# Patient Record
Sex: Female | Born: 1975 | Race: Black or African American | Hispanic: No | Marital: Single | Smoking: Former smoker
Health system: Southern US, Community
[De-identification: ages and names within clinical notes are randomized; demographics above are authoritative.]

## PROBLEM LIST (undated history)

## (undated) ENCOUNTER — Inpatient Hospital Stay (HOSPITAL_COMMUNITY): Payer: Self-pay

## (undated) DIAGNOSIS — F32A Depression, unspecified: Secondary | ICD-10-CM

## (undated) DIAGNOSIS — J45909 Unspecified asthma, uncomplicated: Secondary | ICD-10-CM

## (undated) DIAGNOSIS — F329 Major depressive disorder, single episode, unspecified: Secondary | ICD-10-CM

## (undated) DIAGNOSIS — O2230 Deep phlebothrombosis in pregnancy, unspecified trimester: Secondary | ICD-10-CM

## (undated) DIAGNOSIS — N390 Urinary tract infection, site not specified: Secondary | ICD-10-CM

## (undated) DIAGNOSIS — M1990 Unspecified osteoarthritis, unspecified site: Secondary | ICD-10-CM

## (undated) DIAGNOSIS — F4310 Post-traumatic stress disorder, unspecified: Secondary | ICD-10-CM

## (undated) DIAGNOSIS — K529 Noninfective gastroenteritis and colitis, unspecified: Secondary | ICD-10-CM

## (undated) DIAGNOSIS — K589 Irritable bowel syndrome without diarrhea: Secondary | ICD-10-CM

## (undated) DIAGNOSIS — E282 Polycystic ovarian syndrome: Secondary | ICD-10-CM

## (undated) HISTORY — DX: Post-traumatic stress disorder, unspecified: F43.10

## (undated) HISTORY — DX: Depression, unspecified: F32.A

## (undated) HISTORY — DX: Major depressive disorder, single episode, unspecified: F32.9

## (undated) HISTORY — DX: Unspecified osteoarthritis, unspecified site: M19.90

## (undated) HISTORY — DX: Polycystic ovarian syndrome: E28.2

## (undated) HISTORY — DX: Noninfective gastroenteritis and colitis, unspecified: K52.9

## (undated) HISTORY — PX: WISDOM TOOTH EXTRACTION: SHX21

---

## 1998-06-28 ENCOUNTER — Emergency Department (HOSPITAL_COMMUNITY): Admission: EM | Admit: 1998-06-28 | Discharge: 1998-06-28 | Payer: Self-pay | Admitting: Emergency Medicine

## 2000-07-20 ENCOUNTER — Emergency Department (HOSPITAL_COMMUNITY): Admission: EM | Admit: 2000-07-20 | Discharge: 2000-07-20 | Payer: Self-pay | Admitting: Emergency Medicine

## 2000-07-21 ENCOUNTER — Emergency Department (HOSPITAL_COMMUNITY): Admission: EM | Admit: 2000-07-21 | Discharge: 2000-07-21 | Payer: Self-pay

## 2003-11-06 ENCOUNTER — Emergency Department (HOSPITAL_COMMUNITY): Admission: EM | Admit: 2003-11-06 | Discharge: 2003-11-06 | Payer: Self-pay | Admitting: Emergency Medicine

## 2003-11-08 ENCOUNTER — Inpatient Hospital Stay (HOSPITAL_COMMUNITY): Admission: AD | Admit: 2003-11-08 | Discharge: 2003-11-08 | Payer: Self-pay | Admitting: *Deleted

## 2006-04-18 ENCOUNTER — Emergency Department (HOSPITAL_COMMUNITY): Admission: EM | Admit: 2006-04-18 | Discharge: 2006-04-18 | Payer: Self-pay | Admitting: Emergency Medicine

## 2006-07-29 ENCOUNTER — Ambulatory Visit (HOSPITAL_COMMUNITY): Admission: RE | Admit: 2006-07-29 | Discharge: 2006-07-29 | Payer: Self-pay | Admitting: Obstetrics and Gynecology

## 2006-12-19 ENCOUNTER — Encounter: Admission: RE | Admit: 2006-12-19 | Discharge: 2006-12-19 | Payer: Self-pay | Admitting: Family Medicine

## 2007-12-15 ENCOUNTER — Ambulatory Visit: Payer: Self-pay | Admitting: Hematology and Oncology

## 2008-04-15 ENCOUNTER — Emergency Department (HOSPITAL_COMMUNITY): Admission: EM | Admit: 2008-04-15 | Discharge: 2008-04-15 | Payer: Self-pay | Admitting: Emergency Medicine

## 2008-04-17 ENCOUNTER — Inpatient Hospital Stay (HOSPITAL_COMMUNITY): Admission: AD | Admit: 2008-04-17 | Discharge: 2008-04-17 | Payer: Self-pay | Admitting: Obstetrics and Gynecology

## 2008-04-27 ENCOUNTER — Ambulatory Visit: Payer: Self-pay | Admitting: Vascular Surgery

## 2008-04-30 ENCOUNTER — Inpatient Hospital Stay (HOSPITAL_COMMUNITY): Admission: AD | Admit: 2008-04-30 | Discharge: 2008-04-30 | Payer: Self-pay | Admitting: Obstetrics & Gynecology

## 2008-08-04 ENCOUNTER — Ambulatory Visit (HOSPITAL_COMMUNITY): Admission: RE | Admit: 2008-08-04 | Discharge: 2008-08-04 | Payer: Self-pay | Admitting: Obstetrics & Gynecology

## 2008-09-05 ENCOUNTER — Ambulatory Visit (HOSPITAL_COMMUNITY): Admission: RE | Admit: 2008-09-05 | Discharge: 2008-09-05 | Payer: Self-pay | Admitting: Obstetrics & Gynecology

## 2008-09-27 ENCOUNTER — Ambulatory Visit (HOSPITAL_COMMUNITY): Admission: RE | Admit: 2008-09-27 | Discharge: 2008-09-27 | Payer: Self-pay | Admitting: Obstetrics & Gynecology

## 2008-10-25 ENCOUNTER — Ambulatory Visit (HOSPITAL_COMMUNITY): Admission: RE | Admit: 2008-10-25 | Discharge: 2008-10-25 | Payer: Self-pay | Admitting: Obstetrics & Gynecology

## 2008-11-15 ENCOUNTER — Ambulatory Visit (HOSPITAL_COMMUNITY): Admission: RE | Admit: 2008-11-15 | Discharge: 2008-11-15 | Payer: Self-pay | Admitting: Obstetrics & Gynecology

## 2008-11-24 ENCOUNTER — Emergency Department (HOSPITAL_COMMUNITY): Admission: EM | Admit: 2008-11-24 | Discharge: 2008-11-24 | Payer: Self-pay | Admitting: Emergency Medicine

## 2008-11-28 ENCOUNTER — Inpatient Hospital Stay (HOSPITAL_COMMUNITY): Admission: RE | Admit: 2008-11-28 | Discharge: 2008-11-30 | Payer: Self-pay | Admitting: Obstetrics & Gynecology

## 2008-12-02 ENCOUNTER — Encounter: Admission: RE | Admit: 2008-12-02 | Discharge: 2008-12-31 | Payer: Self-pay | Admitting: Obstetrics & Gynecology

## 2009-02-01 ENCOUNTER — Encounter: Admission: RE | Admit: 2009-02-01 | Discharge: 2009-03-03 | Payer: Self-pay | Admitting: Obstetrics & Gynecology

## 2009-03-04 ENCOUNTER — Encounter: Admission: RE | Admit: 2009-03-04 | Discharge: 2009-04-03 | Payer: Self-pay | Admitting: Obstetrics & Gynecology

## 2009-05-02 ENCOUNTER — Encounter: Admission: RE | Admit: 2009-05-02 | Discharge: 2009-06-01 | Payer: Self-pay | Admitting: Obstetrics & Gynecology

## 2009-06-02 ENCOUNTER — Encounter: Admission: RE | Admit: 2009-06-02 | Discharge: 2009-07-02 | Payer: Self-pay | Admitting: Obstetrics & Gynecology

## 2009-07-03 ENCOUNTER — Encounter: Admission: RE | Admit: 2009-07-03 | Discharge: 2009-07-06 | Payer: Self-pay | Admitting: Obstetrics & Gynecology

## 2009-08-03 ENCOUNTER — Encounter: Admission: RE | Admit: 2009-08-03 | Discharge: 2009-09-02 | Payer: Self-pay | Admitting: Obstetrics & Gynecology

## 2009-09-03 ENCOUNTER — Encounter: Admission: RE | Admit: 2009-09-03 | Discharge: 2009-10-03 | Payer: Self-pay | Admitting: Obstetrics & Gynecology

## 2009-10-04 ENCOUNTER — Encounter: Admission: RE | Admit: 2009-10-04 | Discharge: 2009-10-04 | Payer: Self-pay | Admitting: Obstetrics & Gynecology

## 2010-01-27 ENCOUNTER — Encounter: Payer: Self-pay | Admitting: *Deleted

## 2010-01-29 ENCOUNTER — Encounter: Payer: Self-pay | Admitting: Obstetrics and Gynecology

## 2010-04-11 LAB — CBC
HCT: 34.2 % — ABNORMAL LOW (ref 36.0–46.0)
HCT: 37.2 % (ref 36.0–46.0)
Hemoglobin: 11.6 g/dL — ABNORMAL LOW (ref 12.0–15.0)
Hemoglobin: 12.4 g/dL (ref 12.0–15.0)
MCHC: 33.4 g/dL (ref 30.0–36.0)
MCHC: 33.8 g/dL (ref 30.0–36.0)
MCV: 93.1 fL (ref 78.0–100.0)
MCV: 93.1 fL (ref 78.0–100.0)
Platelets: 235 10*3/uL (ref 150–400)
Platelets: 246 10*3/uL (ref 150–400)
RBC: 3.68 MIL/uL — ABNORMAL LOW (ref 3.87–5.11)
RBC: 3.99 MIL/uL (ref 3.87–5.11)
RDW: 13.5 % (ref 11.5–15.5)
RDW: 13.6 % (ref 11.5–15.5)
WBC: 11.9 10*3/uL — ABNORMAL HIGH (ref 4.0–10.5)
WBC: 20.5 10*3/uL — ABNORMAL HIGH (ref 4.0–10.5)

## 2010-04-11 LAB — RPR: RPR Ser Ql: NONREACTIVE

## 2010-04-11 LAB — GLUCOSE, CAPILLARY: Glucose-Capillary: 108 mg/dL — ABNORMAL HIGH (ref 70–99)

## 2010-04-11 LAB — GLUCOSE, RANDOM: Glucose, Bld: 90 mg/dL (ref 70–99)

## 2010-04-11 LAB — CREATININE, SERUM
Creatinine, Ser: 0.72 mg/dL (ref 0.4–1.2)
GFR calc Af Amer: >60 mL/min (ref >60)
GFR calc non Af Amer: >60 mL/min (ref >60)

## 2010-04-11 LAB — APTT: aPTT: 26 seconds (ref 24–37)

## 2010-04-18 LAB — URINE MICROSCOPIC-ADD ON

## 2010-04-18 LAB — WET PREP, GENITAL: Trich, Wet Prep: NONE SEEN

## 2010-04-18 LAB — URINALYSIS, ROUTINE W REFLEX MICROSCOPIC
Bilirubin Urine: NEGATIVE
Glucose, UA: NEGATIVE mg/dL
Ketones, ur: NEGATIVE mg/dL
Nitrite: NEGATIVE
Protein, ur: NEGATIVE mg/dL
Specific Gravity, Urine: 1.012 (ref 1.005–1.030)
Urobilinogen, UA: 0.2 mg/dL (ref 0.0–1.0)
pH: 6.5 (ref 5.0–8.0)

## 2010-04-18 LAB — URINE CULTURE: Colony Count: NO GROWTH

## 2010-04-18 LAB — HCG, QUANTITATIVE, PREGNANCY: hCG, Beta Chain, Quant, S: 10762 m[IU]/mL — ABNORMAL HIGH (ref <5)

## 2010-04-18 LAB — PREGNANCY, URINE: Preg Test, Ur: POSITIVE

## 2012-01-08 NOTE — L&D Delivery Note (Signed)
Delivery Note At 5:05 AM a viable female was delivered via Vaginal, Spontaneous Delivery (Presentation: Left Occiput Anterior).  APGAR: 9, 9; weight .   Placenta status: Intact, Spontaneous.  Cord: 3 vessels with the following complications: None.  Cord pH: none  Anesthesia: Epidural  Episiotomy: None Lacerations: None Suture Repair: none Est. Blood Loss (mL): 350  Mom to postpartum.  Baby to Couplet care / Skin to Skin.  Kaden Daughdrill A 12/30/2012, 6:07 AM

## 2012-03-22 ENCOUNTER — Ambulatory Visit: Payer: Self-pay | Admitting: Family Medicine

## 2012-03-22 VITALS — BP 120/84 | HR 94 | Temp 98.8°F | Resp 16 | Ht 65.0 in | Wt 255.0 lb

## 2012-03-22 DIAGNOSIS — R300 Dysuria: Secondary | ICD-10-CM

## 2012-03-22 DIAGNOSIS — N390 Urinary tract infection, site not specified: Secondary | ICD-10-CM

## 2012-03-22 LAB — POCT URINALYSIS DIPSTICK
Bilirubin, UA: NEGATIVE
Glucose, UA: NEGATIVE
Ketones, UA: NEGATIVE
Nitrite, UA: NEGATIVE
Protein, UA: 100
Spec Grav, UA: 1.015
Urobilinogen, UA: 0.2
pH, UA: 7

## 2012-03-22 LAB — POCT UA - MICROSCOPIC ONLY
Casts, Ur, LPF, POC: NEGATIVE
Mucus, UA: NEGATIVE
Renal tubular cells: POSITIVE
Yeast, UA: NEGATIVE

## 2012-03-22 MED ORDER — CIPROFLOXACIN HCL 500 MG PO TABS: 500.0000 mg | ORAL_TABLET | Freq: Two times a day (BID) | ORAL | Status: DC

## 2012-03-22 NOTE — Progress Notes (Signed)
Subjective:    Patient ID: Patricia Fitzgerald, female    DOB: 1975/10/31, 37 y.o.   MRN: [id number] Chief Complaint  Patient presents with  . Dysuria  . Pelvic Pain   HPI Has had some burning with urination and lower abdominal pain.  She had some light burning with urination several days ago so thought she wasn't drinking enough water - so started pushing fluids earlier this wk. Today her sxs became really severe.  The abd pain was there this morning - she awoke with it - she took a norco 10/325 that she has for her chronic arthritis - and then had urethral burning after urination.  This morning has had increased frequency, hesitency, urine decreased, no f/c.  Looked a little cloudy. No n/v. Unsure about back pains as has bilateral labral tears and severe arthritis.  No recent abx use, no vag discharge.  Was having some light vaginal bleeding this morning - has PCOS so irreg periods and with intercourse last night.  Has never had a UTI or kidney stone prev.  Was taking sulfasalazine for her ulcerative colitis but stopped taking it 2 wks ago as it was disturbing her monthly menstrual cycle and she read that this med could contribute to UTIs.  Past Medical History  Diagnosis Date  . Depression   . Arthritis   . PCOS (polycystic ovarian syndrome)   . Post traumatic stress disorder (PTSD)   . Inflammatory bowel diseases (IBD)    No current outpatient prescriptions on file prior to visit.   No current facility-administered medications on file prior to visit.   Not on File   Review of Systems  Constitutional: Negative for fever, chills, diaphoresis, activity change, appetite change, fatigue and unexpected weight change.  Gastrointestinal: Positive for abdominal pain. Negative for nausea, vomiting, blood in stool and anal bleeding.  Genitourinary: Positive for dysuria, urgency, frequency, decreased urine volume, vaginal bleeding, difficulty urinating, menstrual problem and pelvic pain. Negative  for hematuria, flank pain, vaginal discharge, enuresis, genital sores, vaginal pain and dyspareunia.  Musculoskeletal: Positive for myalgias, arthralgias and gait problem.  Skin: Negative for rash.  Hematological: Negative for adenopathy.      BP 120/84  Pulse 94  Temp(Src) 98.8 F (37.1 C) (Oral)  Resp 16  Ht 5\' 5"  (1.651 m)  Wt 255 lb (115.667 kg)  BMI 42.43 kg/m2  SpO2 100% Objective:   Physical Exam  Constitutional: She is oriented to person, place, and time. She appears well-developed and well-nourished. No distress.  HENT:  Head: Normocephalic and atraumatic.  Neck: Normal range of motion. Neck supple. No thyromegaly present.  Cardiovascular: Normal rate, regular rhythm, normal heart sounds and intact distal pulses.   Pulmonary/Chest: Effort normal and breath sounds normal. No respiratory distress.  Abdominal: Soft. Bowel sounds are normal. There is no hepatosplenomegaly. There is generalized tenderness. There is no rebound, no guarding and no CVA tenderness. No hernia.  Genitourinary: No labial fusion. There is no rash, tenderness, lesion or injury on the right labia. There is no rash, tenderness, lesion or injury on the left labia. Uterus is not deviated, not enlarged, not fixed and not tender. Cervix exhibits no motion tenderness, no discharge and no friability. Right adnexum displays no mass, no tenderness and no fullness. Left adnexum displays no mass, no tenderness and no fullness. No erythema, tenderness or bleeding around the vagina. No foreign body around the vagina. No signs of injury around the vagina. No vaginal discharge found.  Musculoskeletal: She exhibits no edema.  Lymphadenopathy:    She has no cervical adenopathy.  Neurological: She is alert and oriented to person, place, and time.  Skin: Skin is warm and dry. She is not diaphoretic. No erythema.  Psychiatric: She has a normal mood and affect. Her behavior is normal.      Results for orders placed in visit on  03/22/12  POCT URINALYSIS DIPSTICK      Result Value Range   Color, UA yellow     Clarity, UA cloudy     Glucose, UA neg     Bilirubin, UA neg     Ketones, UA neg     Spec Grav, UA 1.015     Blood, UA large     pH, UA 7.0     Protein, UA 100     Urobilinogen, UA 0.2     Nitrite, UA neg     Leukocytes, UA small (1+)    POCT UA - MICROSCOPIC ONLY      Result Value Range   WBC, Ur, HPF, POC TNTC     RBC, urine, microscopic TNTC     Bacteria, U Microscopic neg     Mucus, UA neg     Epithelial cells, urine per micros 1-4     Crystals, Ur, HPF, POC neg     Casts, Ur, LPF, POC neg     Yeast, UA neg     Renal tubular cells pos      Assessment & Plan:  Dysuria - Plan: POCT urinalysis dipstick, POCT UA - Microscopic Only, Urine culture  UTI (urinary tract infection) - lots of blood but no bacteria. Will cover with cipro while culture is pending. If clx neg, rec return to clinic for further eval - PCOS, IBS could be complicating or may even need to look for kidney stones.  Meds ordered this encounter  Medications  . ALPRAZolam (XANAX) 1 MG tablet    Sig: Take 1 mg by mouth at bedtime as needed for sleep.  Marland Kitchen venlafaxine (EFFEXOR) 75 MG tablet    Sig: Take 75 mg by mouth 3 (three) times daily.  Marland Kitchen zolpidem (AMBIEN) 10 MG tablet    Sig: Take 10 mg by mouth at bedtime as needed for sleep. Pt not sure of dose she takes.  Marland Kitchen gabapentin (NEURONTIN) 100 MG capsule    Sig: Take 100 mg by mouth. Pt not sure of what dose she takes.  Marland Kitchen HYDROcodone-acetaminophen (NORCO) 10-325 MG per tablet    Sig: Take 1 tablet by mouth. Pt not sure of what dose she takes.  . ciprofloxacin (CIPRO) 500 MG tablet    Sig: Take 1 tablet (500 mg total) by mouth 2 (two) times daily.    Dispense:  6 tablet    Refill:  0

## 2012-03-22 NOTE — Patient Instructions (Addendum)
Urinary Tract Infection Urinary tract infections (UTIs) can develop anywhere along your urinary tract. Your urinary tract is your body's drainage system for removing wastes and extra water. Your urinary tract includes two kidneys, two ureters, a bladder, and a urethra. Your kidneys are a pair of bean-shaped organs. Each kidney is about the size of your fist. They are located below your ribs, one on each side of your spine. CAUSES Infections are caused by microbes, which are microscopic organisms, including fungi, viruses, and bacteria. These organisms are so small that they can only be seen through a microscope. Bacteria are the microbes that most commonly cause UTIs. SYMPTOMS  Symptoms of UTIs may vary by age and gender of the patient and by the location of the infection. Symptoms in young women typically include a frequent and intense urge to urinate and a painful, burning feeling in the bladder or urethra during urination. Older women and men are more likely to be tired, shaky, and weak and have muscle aches and abdominal pain. A fever may mean the infection is in your kidneys. Other symptoms of a kidney infection include pain in your back or sides below the ribs, nausea, and vomiting. DIAGNOSIS To diagnose a UTI, your caregiver will ask you about your symptoms. Your caregiver also will ask to provide a urine sample. The urine sample will be tested for bacteria and white blood cells. White blood cells are made by your body to help fight infection. TREATMENT  Typically, UTIs can be treated with medication. Because most UTIs are caused by a bacterial infection, they usually can be treated with the use of antibiotics. The choice of antibiotic and length of treatment depend on your symptoms and the type of bacteria causing your infection. HOME CARE INSTRUCTIONS  If you were prescribed antibiotics, take them exactly as your caregiver instructs you. Finish the medication even if you feel better after you  have only taken some of the medication.  Drink enough water and fluids to keep your urine clear or pale yellow.  Avoid caffeine, tea, and carbonated beverages. They tend to irritate your bladder.  Empty your bladder often. Avoid holding urine for long periods of time.  Empty your bladder before and after sexual intercourse.  After a bowel movement, women should cleanse from front to back. Use each tissue only once. SEEK MEDICAL CARE IF:   You have back pain.  You develop a fever.  Your symptoms do not begin to resolve within 3 days. SEEK IMMEDIATE MEDICAL CARE IF:   You have severe back pain or lower abdominal pain.  You develop chills.  You have nausea or vomiting.  You have continued burning or discomfort with urination. MAKE SURE YOU:   Understand these instructions.  Will watch your condition.  Will get help right away if you are not doing well or get worse. Document Released: 10/03/2004 Document Revised: 06/25/2011 Document Reviewed: 02/01/2011 [employer] Patient Information 2013 [employer].  

## 2012-03-25 LAB — URINE CULTURE: Colony Count: 65000

## 2012-05-13 ENCOUNTER — Inpatient Hospital Stay (HOSPITAL_COMMUNITY)
Admission: AD | Admit: 2012-05-13 | Discharge: 2012-05-13 | Disposition: A | Discharge status: Home / Self Care | Payer: Medicaid Other | Source: Ambulatory Visit | Attending: Obstetrics & Gynecology | Admitting: Obstetrics & Gynecology

## 2012-05-13 ENCOUNTER — Inpatient Hospital Stay (HOSPITAL_COMMUNITY): Payer: Medicaid Other

## 2012-05-13 ENCOUNTER — Encounter (HOSPITAL_COMMUNITY): Payer: Self-pay

## 2012-05-13 DIAGNOSIS — B9689 Other specified bacterial agents as the cause of diseases classified elsewhere: Secondary | ICD-10-CM | POA: Insufficient documentation

## 2012-05-13 DIAGNOSIS — A499 Bacterial infection, unspecified: Secondary | ICD-10-CM

## 2012-05-13 DIAGNOSIS — O209 Hemorrhage in early pregnancy, unspecified: Secondary | ICD-10-CM | POA: Insufficient documentation

## 2012-05-13 DIAGNOSIS — O2390 Unspecified genitourinary tract infection in pregnancy, unspecified trimester: Secondary | ICD-10-CM | POA: Insufficient documentation

## 2012-05-13 DIAGNOSIS — N760 Acute vaginitis: Secondary | ICD-10-CM | POA: Insufficient documentation

## 2012-05-13 HISTORY — DX: Deep phlebothrombosis in pregnancy, unspecified trimester: O22.30

## 2012-05-13 HISTORY — DX: Urinary tract infection, site not specified: N39.0

## 2012-05-13 HISTORY — DX: Irritable bowel syndrome, unspecified: K58.9

## 2012-05-13 HISTORY — DX: Post-traumatic stress disorder, unspecified: F43.10

## 2012-05-13 LAB — CBC
HCT: 34 % — ABNORMAL LOW (ref 36.0–46.0)
Hemoglobin: 10.9 g/dL — ABNORMAL LOW (ref 12.0–15.0)
MCHC: 32.1 g/dL (ref 30.0–36.0)
RBC: 4.01 MIL/uL (ref 3.87–5.11)
WBC: 9.9 10*3/uL (ref 4.0–10.5)

## 2012-05-13 LAB — ABO/RH: ABO/RH(D): A POS

## 2012-05-13 LAB — OB RESULTS CONSOLE GC/CHLAMYDIA: Chlamydia: NEGATIVE

## 2012-05-13 LAB — HCG, QUANTITATIVE, PREGNANCY: hCG, Beta Chain, Quant, S: 3317 m[IU]/mL — ABNORMAL HIGH (ref <5)

## 2012-05-13 MED ORDER — METRONIDAZOLE 500 MG PO TABS: 500.0000 mg | ORAL_TABLET | Freq: Two times a day (BID) | ORAL | Status: DC

## 2012-05-13 NOTE — MAU Note (Signed)
Pt states LMP-03/31/2012, had pap smear done last Wednesday. Had spotting since then, however last pm went from spotting to bleeding small amt. Denies blood clots. Hx of blood clots and miscarriages.

## 2012-05-13 NOTE — MAU Provider Note (Signed)
History     CSN: [id number]  Arrival date and time: 05/13/12 4098   First Provider Initiated Contact with Patient 05/13/12 1033      Chief Complaint  Patient presents with  . Vaginal Bleeding   HPI Ms. Makari Sanko is a 37 y.o. [id number] at [redacted]w[redacted]d who present to MAU today with complaint of vaginal bleeding. The patient states that she had a pap smear last Wednesday and had some spotting afterward. This changed to heavier bleeding yesterday. She did not bleed enough to soak a pad. She has not yet had care or [place] for this pregnancy. She plans to get care at the [facility] hospital in [place]. She denies other vaginal discharge, abdominal pain, N/V, fever, UTI symptoms or dizziness. She had some off and on cramping yesterday, but none today.   OB History   Grav Para Term Preterm Abortions TAB SAB Ect Mult Living   5    3 1 2          Past Medical History  Diagnosis Date  . Depression   . Arthritis   . PCOS (polycystic ovarian syndrome)   . Post traumatic stress disorder (PTSD)   . Inflammatory bowel diseases (IBD)   . PTSD (post-traumatic stress disorder)   . UTI (lower urinary tract infection)   . IBS (irritable bowel syndrome)   . DVT (deep vein thrombosis) in pregnancy     left leg.    History reviewed. No pertinent past surgical history.  Family History  Problem Relation Age of Onset  . Diabetes Mother   . Congestive Heart Failure Mother   . Diabetes Father   . Diabetes Brother   . Cancer Maternal Grandmother   . Heart disease Paternal Grandmother     History  Substance Use Topics  . Smoking status: Former Games developer  . Smokeless tobacco: Not on file  . Alcohol Use: No    Allergies: No Known Allergies  Prescriptions prior to admission  Medication Sig Dispense Refill  . ferrous sulfate 325 (65 FE) MG tablet Take 325 mg by mouth daily with breakfast.      . FOLIC ACID PO Take 1 tablet by mouth daily.      Marland Kitchen venlafaxine (EFFEXOR) 75 MG tablet Take 75 mg by mouth 3  (three) times daily.        Review of Systems  Constitutional: Negative for fever and malaise/fatigue.  Gastrointestinal: Negative for nausea, vomiting, abdominal pain, diarrhea and constipation.  Genitourinary: Negative for dysuria, urgency and frequency.       + vaginal bleeding Neg - vaginal discharge  Neurological: Negative for dizziness.   Physical Exam   Blood pressure 128/79, pulse 77, temperature 98.1 F (36.7 C), temperature source Oral, resp. rate 16, height 5\' 4"  (1.626 m), last menstrual period 03/31/2012.  Physical Exam  Constitutional: She is oriented to person, place, and time. She appears well-developed and well-nourished. No distress.  HENT:  Head: Normocephalic and atraumatic.  Cardiovascular: Normal rate, regular rhythm and normal heart sounds.   Respiratory: Effort normal and breath sounds normal. No respiratory distress.  GI: Soft. Bowel sounds are normal. She exhibits no distension and no mass. There is no tenderness. There is no rebound and no guarding.  Genitourinary: Uterus is not enlarged (exam difficult due to maternal body habitus) and not tender. Cervix exhibits discharge (small amount of brown blood noted at the cervical os). Cervix exhibits no motion tenderness and no friability. Right adnexum displays no mass and no tenderness. Left adnexum  displays no mass and no tenderness. There is bleeding (small amount of dark blood noted in the vagina) around the vagina. No vaginal discharge found.  Neurological: She is alert and oriented to person, place, and time.  Skin: Skin is warm and dry. No erythema.  Psychiatric: She has a normal mood and affect.   Results for orders placed during the hospital encounter of 05/13/12 (from the past 24 hour(s))  POCT PREGNANCY, URINE     Status: Abnormal   Collection Time    05/13/12 10:09 AM      Result Value Range   Preg Test, Ur POSITIVE (*) NEGATIVE  WET PREP, GENITAL     Status: Abnormal   Collection Time    05/13/12  10:36 AM      Result Value Range   Yeast Wet Prep HPF POC NONE SEEN  NONE SEEN   Trich, Wet Prep NONE SEEN  NONE SEEN   Clue Cells Wet Prep HPF POC FEW (*) NONE SEEN   WBC, Wet Prep HPF POC MODERATE (*) NONE SEEN  CBC     Status: Abnormal   Collection Time    05/13/12 10:50 AM      Result Value Range   WBC 9.9  4.0 - 10.5 K/uL   RBC 4.01  3.87 - 5.11 MIL/uL   Hemoglobin 10.9 (*) 12.0 - 15.0 g/dL   HCT 16.1 (*) 09.6 - 04.5 %   MCV 84.8  78.0 - 100.0 fL   MCH 27.2  26.0 - 34.0 pg   MCHC 32.1  30.0 - 36.0 g/dL   RDW 40.9  81.1 - 91.4 %   Platelets 271  150 - 400 K/uL  ABO/RH     Status: None   Collection Time    05/13/12 10:50 AM      Result Value Range   ABO/RH(D) A POS      MAU Course  Procedures None  MDM Wet prep, GC/Chlamydia, CBC, ABO/Rh, quant hCG and [place] today  Assessment and Plan  A: IUGS and YS at 5w 1 d Bleeding in early pregnancy Bacterial vaginosis  P: Discharge home Rx for Flagyl sent to patient's pharmacy Bleeding precautions discussed Pregnancy confirmation letter given Patient encouraged to start care with provider of her choice Patient may return to MAU as needed or if her condition were to change or worsen  Freddi Starr, PA-C  05/13/2012, 12:06 PM

## 2012-05-14 LAB — GC/CHLAMYDIA PROBE AMP
CT Probe RNA: NEGATIVE
GC Probe RNA: NEGATIVE

## 2012-05-17 ENCOUNTER — Encounter (HOSPITAL_COMMUNITY): Payer: Self-pay | Admitting: Family Medicine

## 2012-05-17 ENCOUNTER — Emergency Department (HOSPITAL_COMMUNITY)
Admission: EM | Admit: 2012-05-17 | Discharge: 2012-05-17 | Disposition: A | Discharge status: Home / Self Care | Payer: No Typology Code available for payment source | Attending: Emergency Medicine | Admitting: Emergency Medicine

## 2012-05-17 DIAGNOSIS — Z862 Personal history of diseases of the blood and blood-forming organs and certain disorders involving the immune mechanism: Secondary | ICD-10-CM | POA: Insufficient documentation

## 2012-05-17 DIAGNOSIS — Z86718 Personal history of other venous thrombosis and embolism: Secondary | ICD-10-CM | POA: Insufficient documentation

## 2012-05-17 DIAGNOSIS — Z8639 Personal history of other endocrine, nutritional and metabolic disease: Secondary | ICD-10-CM | POA: Insufficient documentation

## 2012-05-17 DIAGNOSIS — F329 Major depressive disorder, single episode, unspecified: Secondary | ICD-10-CM | POA: Insufficient documentation

## 2012-05-17 DIAGNOSIS — T07XXXA Unspecified multiple injuries, initial encounter: Secondary | ICD-10-CM

## 2012-05-17 DIAGNOSIS — Y9389 Activity, other specified: Secondary | ICD-10-CM | POA: Insufficient documentation

## 2012-05-17 DIAGNOSIS — Z87891 Personal history of nicotine dependence: Secondary | ICD-10-CM | POA: Insufficient documentation

## 2012-05-17 DIAGNOSIS — Z79899 Other long term (current) drug therapy: Secondary | ICD-10-CM | POA: Insufficient documentation

## 2012-05-17 DIAGNOSIS — Z8739 Personal history of other diseases of the musculoskeletal system and connective tissue: Secondary | ICD-10-CM | POA: Insufficient documentation

## 2012-05-17 DIAGNOSIS — F3289 Other specified depressive episodes: Secondary | ICD-10-CM | POA: Insufficient documentation

## 2012-05-17 DIAGNOSIS — Z8719 Personal history of other diseases of the digestive system: Secondary | ICD-10-CM | POA: Insufficient documentation

## 2012-05-17 DIAGNOSIS — IMO0002 Reserved for concepts with insufficient information to code with codable children: Secondary | ICD-10-CM | POA: Insufficient documentation

## 2012-05-17 DIAGNOSIS — Z87440 Personal history of urinary (tract) infections: Secondary | ICD-10-CM | POA: Insufficient documentation

## 2012-05-17 DIAGNOSIS — Y92410 Unspecified street and highway as the place of occurrence of the external cause: Secondary | ICD-10-CM | POA: Insufficient documentation

## 2012-05-17 DIAGNOSIS — Z8659 Personal history of other mental and behavioral disorders: Secondary | ICD-10-CM | POA: Insufficient documentation

## 2012-05-17 MED ORDER — ACETAMINOPHEN 500 MG PO TABS: 500.0000 mg | ORAL_TABLET | Freq: Four times a day (QID) | ORAL | Status: DC | PRN
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
## 2012-05-17 NOTE — ED Notes (Signed)
PT ambulated with baseline gait; VSS; A&Ox3; no signs of distress; respirations even and unlabored; skin warm and dry; no questions upon discharge.  
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
## 2012-05-17 NOTE — ED Provider Notes (Signed)
Medical screening examination/treatment/procedure(s) were performed by non-physician practitioner and as supervising physician I was immediately available for consultation/collaboration. Sencere Symonette, MD, FACEP   Veryl Winemiller L Andry Bogden, MD 05/17/12 1636 

## 2012-06-28 ENCOUNTER — Inpatient Hospital Stay (HOSPITAL_COMMUNITY)
Admission: AD | Admit: 2012-06-28 | Discharge: 2012-06-29 | Disposition: A | Discharge status: Home / Self Care | Payer: Medicaid Other | Source: Ambulatory Visit | Attending: Obstetrics & Gynecology | Admitting: Obstetrics & Gynecology

## 2012-06-28 DIAGNOSIS — O4692 Antepartum hemorrhage, unspecified, second trimester: Secondary | ICD-10-CM

## 2012-06-28 DIAGNOSIS — O209 Hemorrhage in early pregnancy, unspecified: Secondary | ICD-10-CM | POA: Insufficient documentation

## 2012-06-28 HISTORY — DX: Unspecified asthma, uncomplicated: J45.909
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
## 2012-06-29 ENCOUNTER — Encounter (HOSPITAL_COMMUNITY): Payer: Self-pay | Admitting: *Deleted

## 2012-06-29 DIAGNOSIS — O4690 Antepartum hemorrhage, unspecified, unspecified trimester: Secondary | ICD-10-CM

## 2012-06-29 LAB — CBC
HCT: 35 % — ABNORMAL LOW (ref 36.0–46.0)
Hemoglobin: 11.7 g/dL — ABNORMAL LOW (ref 12.0–15.0)
MCHC: 33.4 g/dL (ref 30.0–36.0)
WBC: 10.8 10*3/uL — ABNORMAL HIGH (ref 4.0–10.5)

## 2012-06-29 LAB — WET PREP, GENITAL
Clue Cells Wet Prep HPF POC: NONE SEEN
Trich, Wet Prep: NONE SEEN

## 2012-06-29 LAB — HCG, QUANTITATIVE, PREGNANCY: hCG, Beta Chain, Quant, S: 48907 m[IU]/mL — ABNORMAL HIGH (ref <5)

## 2012-06-29 MED ORDER — ONDANSETRON 4 MG PO TBDP: 4.0000 mg | ORAL_TABLET | Freq: Three times a day (TID) | ORAL | Status: DC | PRN

## 2012-06-29 NOTE — MAU Provider Note (Signed)
History     CSN: [id number]  Arrival date and time: 06/28/12 2341   First Provider Initiated Contact with Patient 06/29/12 0033      Chief Complaint  Patient presents with  . Vaginal Bleeding   Vaginal Bleeding    Patricia Fitzgerald is a 37 y.o. [id number] at [redacted]w[redacted]d who presents today with vaginal bleeding. She states she last had intercourse on Wednesday. She had some spotting after that, and then today she had a "gush" that happened once. She has not had to wear a pad. She denies any cramping or pain. She has an appointment on July 7th with Dr. Tamela Oddi.   Past Medical History  Diagnosis Date  . Depression   . Arthritis   . PCOS (polycystic ovarian syndrome)   . Post traumatic stress disorder (PTSD)   . Inflammatory bowel diseases (IBD)   . PTSD (post-traumatic stress disorder)   . UTI (lower urinary tract infection)   . IBS (irritable bowel syndrome)   . DVT (deep vein thrombosis) in pregnancy     left leg.  . Asthma     Past Surgical History  Procedure Laterality Date  . Wisdom tooth extraction      Family History  Problem Relation Age of Onset  . Diabetes Mother   . Congestive Heart Failure Mother   . Diabetes Father   . Diabetes Brother   . Cancer Maternal Grandmother   . Heart disease Paternal Grandmother     History  Substance Use Topics  . Smoking status: Former Smoker -- 0.25 packs/day for .5 years    Types: Cigarettes    Quit date: 06/30/2002  . Smokeless tobacco: Not on file  . Alcohol Use: No    Allergies: No Known Allergies  Prescriptions prior to admission  Medication Sig Dispense Refill  . acetaminophen (TYLENOL) 500 MG tablet Take 1 tablet (500 mg total) by mouth every 6 (six) hours as needed for pain.  30 tablet  0  . ferrous sulfate 325 (65 FE) MG tablet Take 325 mg by mouth daily with breakfast.      . FOLIC ACID PO Take 1 tablet by mouth daily.      . metroNIDAZOLE (FLAGYL) 500 MG tablet Take 1 tablet (500 mg total) by mouth 2 (two)  times daily.  14 tablet  0  . venlafaxine (EFFEXOR) 75 MG tablet Take 75 mg by mouth 3 (three) times daily.        Review of Systems  Genitourinary: Positive for vaginal bleeding.   Physical Exam   Blood pressure 126/71, pulse 98, temperature 99.2 F (37.3 C), temperature source Oral, resp. rate 20, height 5\' 4"  (1.626 m), weight 119.296 kg (263 lb), last menstrual period 03/31/2012, SpO2 100.00%.  Physical Exam  Nursing note and vitals reviewed. Constitutional: She is oriented to person, place, and time. She appears well-developed and well-nourished. No distress.  Cardiovascular: Normal rate.   Respiratory: Effort normal.  GI: Soft. There is no tenderness.  Genitourinary:   .External: no lesion Vagina: small amount of brown blood seen Cervix: pink, smooth, no CMT, closed Uterus: AGA, fht 150's   Neurological: She is alert and oriented to person, place, and time.  Skin: Skin is warm and dry.  Psychiatric: She has a normal mood and affect.    MAU Course  Procedures  Results for orders placed during the hospital encounter of 06/28/12 (from the past 24 hour(s))  CBC     Status: Abnormal   Collection Time  06/28/12 11:55 PM      Result Value Range   WBC 10.8 (*) 4.0 - 10.5 K/uL   RBC 4.18  3.87 - 5.11 MIL/uL   Hemoglobin 11.7 (*) 12.0 - 15.0 g/dL   HCT 16.1 (*) 09.6 - 04.5 %   MCV 83.7  78.0 - 100.0 fL   MCH 28.0  26.0 - 34.0 pg   MCHC 33.4  30.0 - 36.0 g/dL   RDW 40.9  81.1 - 91.4 %   Platelets 257  150 - 400 K/uL     Assessment and Plan   1. Vaginal bleeding in pregnancy, second trimester    Bleeding precautions reviewed Start pnc as soon as possible Return to MAU as needed   Tawnya Crook 06/29/2012, 12:37 AM

## 2012-06-30 LAB — GC/CHLAMYDIA PROBE AMP: CT Probe RNA: NEGATIVE

## 2012-07-06 NOTE — MAU Provider Note (Signed)
Pt had IUGS at 5 weeks.  FHT heard today.  Attestation of Attending Supervision of Advanced Practitioner (CNM/NP): Evaluation and management procedures were performed by the Advanced Practitioner under my supervision and collaboration. I have reviewed the Advanced Practitioner's note and chart, and I agree with the management and plan.  Evora Schechter H. 9:30 AM

## 2012-07-13 ENCOUNTER — Ambulatory Visit (INDEPENDENT_AMBULATORY_CARE_PROVIDER_SITE_OTHER): Payer: Medicaid Other | Admitting: Obstetrics & Gynecology

## 2012-07-13 ENCOUNTER — Encounter: Payer: Self-pay | Admitting: Obstetrics & Gynecology

## 2012-07-13 VITALS — BP 119/82 | Temp 98.7°F | Wt 260.6 lb

## 2012-07-13 DIAGNOSIS — O09529 Supervision of elderly multigravida, unspecified trimester: Secondary | ICD-10-CM

## 2012-07-13 DIAGNOSIS — O09899 Supervision of other high risk pregnancies, unspecified trimester: Secondary | ICD-10-CM | POA: Insufficient documentation

## 2012-07-13 DIAGNOSIS — Z3480 Encounter for supervision of other normal pregnancy, unspecified trimester: Secondary | ICD-10-CM

## 2012-07-13 DIAGNOSIS — O99212 Obesity complicating pregnancy, second trimester: Secondary | ICD-10-CM | POA: Insufficient documentation

## 2012-07-13 DIAGNOSIS — Z3482 Encounter for supervision of other normal pregnancy, second trimester: Secondary | ICD-10-CM

## 2012-07-13 DIAGNOSIS — E669 Obesity, unspecified: Secondary | ICD-10-CM

## 2012-07-13 DIAGNOSIS — O0990 Supervision of high risk pregnancy, unspecified, unspecified trimester: Secondary | ICD-10-CM | POA: Insufficient documentation

## 2012-07-13 DIAGNOSIS — O09892 Supervision of other high risk pregnancies, second trimester: Secondary | ICD-10-CM

## 2012-07-13 DIAGNOSIS — O2230 Deep phlebothrombosis in pregnancy, unspecified trimester: Secondary | ICD-10-CM

## 2012-07-13 DIAGNOSIS — I82409 Acute embolism and thrombosis of unspecified deep veins of unspecified lower extremity: Secondary | ICD-10-CM

## 2012-07-13 DIAGNOSIS — IMO0002 Reserved for concepts with insufficient information to code with codable children: Secondary | ICD-10-CM | POA: Insufficient documentation

## 2012-07-13 LAB — POCT URINALYSIS DIPSTICK
Blood, UA: NEGATIVE
Glucose, UA: NEGATIVE
Ketones, UA: NEGATIVE
Spec Grav, UA: 1.01

## 2012-07-13 NOTE — Progress Notes (Signed)
Pulse- 85 . Subjective:    Patricia Fitzgerald is being seen today for her first obstetrical visit.  This is not a planned pregnancy. She is at [redacted]w[redacted]d gestation. Her obstetrical history is significant for DVT. Relationship with FOB: ex-spouse. Patient does intend to breast feed. Pregnancy history fully reviewed.  Menstrual History: OB History   Grav Para Term Preterm Abortions TAB SAB Ect Mult Living   5 1 1  3  0 2   1      Menarche age: 37    Patient's last menstrual period was 03/31/2012.    The following portions of the patient's history were reviewed and updated as appropriate: allergies, current medications, past family history, past medical history, past social history, past surgical history and problem list.  Review of Systems Pertinent items are noted in HPI.    Objective:   No exam today  Assessment:    Pregnancy at [redacted]w[redacted]d weeks    Plan:   Review previous records Problem list reviewed and updated. Referral -->MFM Routine Dental care/cleanings recommended Recommend 10 - 20 lb weight gain Role of ultrasound in pregnancy discussed; fetal survey: requested.  Follow up in 4 weeks. 50% of 15 min visit spent on counseling and coordination of care.

## 2012-07-13 NOTE — Progress Notes (Signed)
Cardiac activity on U/S. 

## 2012-07-13 NOTE — Patient Instructions (Addendum)
Pregnancy - Second Trimester The second trimester is the period between 13 to 27 weeks of your pregnancy. It is important to follow your doctor's instructions. HOME CARE   Do not smoke.  Do not drink alcohol or use drugs.  Only take medicine as told by your doctor.  Take prenatal vitamins as told. The vitamin should contain 1 milligram of folic acid.  Exercise.  Eat healthy foods. Eat regular, well-balanced meals.  You can have sex (intercourse) if there are no other problems with the pregnancy.  Do not use hot tubs, steam rooms, or saunas.  Wear a seat belt while driving.  Avoid raw meat, uncooked cheese, and litter boxes and soil used by cats.  Visit your dentist. Cleanings are okay. GET HELP RIGHT AWAY IF:   You have a temperature by mouth above 102 F (38.9 C), not controlled by medicine.  Fluid is coming from your vagina.  Blood is coming from your vagina. Light spotting is common, especially after sex (intercourse).  You have a bad smelling fluid (discharge) coming from the vagina. The fluid changes from clear to white.  You still feel sick to your stomach (nauseous).  You throw up (vomit) blood.  You lose or gain more than 2 pounds (0.9 kilograms) of weight in a week, or as suggested by your doctor.  Your face, hands, feet, or legs get puffy (swell).  You get exposed to German measles and have never had them.  You get exposed to fifth disease or chickenpox.  You have belly (abdominal) pain.  You have a bad headache that will not go away.  You have watery poop (diarrhea), pain when you pee (urinate), or have shortness of breath.  You start to have problems seeing (blurry or double vision).  You fall, are in a car accident, or have any kind of trauma.  There is mental or physical violence at home.  You have any concerns or worries during your pregnancy. MAKE SURE YOU:   Understand these instructions.  Will watch your condition.  Will get help  right away if you are not doing well or get worse. Document Released: 03/20/2009 Document Revised: 03/18/2011 Document Reviewed: 03/20/2009 Exit[employer] Patient Information 2014 [employer].  

## 2012-07-14 LAB — OBSTETRIC PANEL
Basophils Absolute: 0 10*3/uL (ref 0.0–0.1)
Eosinophils Absolute: 0.1 10*3/uL (ref 0.0–0.7)
Hepatitis B Surface Ag: NEGATIVE
Lymphs Abs: 3.1 10*3/uL (ref 0.7–4.0)
MCH: 27.8 pg (ref 26.0–34.0)
Neutrophils Relative %: 55 % (ref 43–77)
Platelets: 298 10*3/uL (ref 150–400)
RBC: 4.42 MIL/uL (ref 3.87–5.11)
WBC: 8.7 10*3/uL (ref 4.0–10.5)

## 2012-07-14 LAB — T4, FREE: Free T4: 1.22 ng/dL (ref 0.80–1.80)

## 2012-07-14 LAB — CULTURE, OB URINE: Colony Count: NO GROWTH

## 2012-07-15 LAB — HEMOGLOBINOPATHY EVALUATION
Hemoglobin Other: 0 %
Hgb A2 Quant: 2.7 % (ref 2.2–3.2)
Hgb A: 97.3 % (ref 96.8–97.8)
Hgb F Quant: 0 % (ref 0.0–2.0)
Hgb S Quant: 0 %

## 2012-07-27 DIAGNOSIS — Z8742 Personal history of other diseases of the female genital tract: Secondary | ICD-10-CM | POA: Insufficient documentation

## 2012-07-27 DIAGNOSIS — O99340 Other mental disorders complicating pregnancy, unspecified trimester: Secondary | ICD-10-CM | POA: Insufficient documentation

## 2012-07-27 DIAGNOSIS — O09529 Supervision of elderly multigravida, unspecified trimester: Secondary | ICD-10-CM | POA: Insufficient documentation

## 2012-07-28 ENCOUNTER — Other Ambulatory Visit: Payer: Self-pay | Admitting: Obstetrics & Gynecology

## 2012-07-28 ENCOUNTER — Institutional Professional Consult (permissible substitution): Payer: Medicaid Other

## 2012-07-28 DIAGNOSIS — Z0489 Encounter for examination and observation for other specified reasons: Secondary | ICD-10-CM

## 2012-07-29 ENCOUNTER — Encounter: Payer: Self-pay | Admitting: Obstetrics & Gynecology

## 2012-07-29 DIAGNOSIS — G4730 Sleep apnea, unspecified: Secondary | ICD-10-CM | POA: Insufficient documentation

## 2012-08-07 ENCOUNTER — Encounter: Payer: Self-pay | Admitting: Obstetrics & Gynecology

## 2012-08-07 DIAGNOSIS — G4733 Obstructive sleep apnea (adult) (pediatric): Secondary | ICD-10-CM | POA: Insufficient documentation

## 2012-08-07 DIAGNOSIS — J45909 Unspecified asthma, uncomplicated: Secondary | ICD-10-CM | POA: Insufficient documentation

## 2012-08-10 ENCOUNTER — Other Ambulatory Visit: Payer: Medicaid Other

## 2012-08-10 ENCOUNTER — Ambulatory Visit (INDEPENDENT_AMBULATORY_CARE_PROVIDER_SITE_OTHER): Payer: Medicaid Other | Admitting: Obstetrics & Gynecology

## 2012-08-10 VITALS — BP 126/83 | Temp 98.3°F | Wt 261.0 lb

## 2012-08-10 DIAGNOSIS — Z3482 Encounter for supervision of other normal pregnancy, second trimester: Secondary | ICD-10-CM

## 2012-08-10 DIAGNOSIS — Z3480 Encounter for supervision of other normal pregnancy, unspecified trimester: Secondary | ICD-10-CM

## 2012-08-10 LAB — POCT URINALYSIS DIPSTICK
Bilirubin, UA: NEGATIVE
Glucose, UA: NEGATIVE
Spec Grav, UA: 1.015
Urobilinogen, UA: NEGATIVE

## 2012-08-10 LAB — CBC
HCT: 35.3 % — ABNORMAL LOW (ref 36.0–46.0)
Hemoglobin: 12.3 g/dL (ref 12.0–15.0)
MCH: 28.6 pg (ref 26.0–34.0)
MCV: 82.1 fL (ref 78.0–100.0)
RBC: 4.3 MIL/uL (ref 3.87–5.11)

## 2012-08-10 NOTE — Progress Notes (Signed)
C/O epigastric colicky pain.

## 2012-08-10 NOTE — Progress Notes (Signed)
P-90 Pt states she is having pelvic pressure. Pt states she is having about 3 contractions a day.

## 2012-08-11 ENCOUNTER — Ambulatory Visit (HOSPITAL_COMMUNITY)
Admission: RE | Admit: 2012-08-11 | Discharge: 2012-08-11 | Disposition: A | Discharge status: Home / Self Care | Payer: Medicaid Other | Source: Ambulatory Visit | Attending: Obstetrics & Gynecology | Admitting: Obstetrics & Gynecology

## 2012-08-11 DIAGNOSIS — Z0489 Encounter for examination and observation for other specified reasons: Secondary | ICD-10-CM

## 2012-08-11 DIAGNOSIS — E669 Obesity, unspecified: Secondary | ICD-10-CM | POA: Insufficient documentation

## 2012-08-11 DIAGNOSIS — O09529 Supervision of elderly multigravida, unspecified trimester: Secondary | ICD-10-CM | POA: Insufficient documentation

## 2012-08-11 LAB — GLUCOSE TOLERANCE, 2 HOURS W/ 1HR
Glucose, 1 hour: 175 mg/dL — ABNORMAL HIGH (ref 70–170)
Glucose, 2 hour: 136 mg/dL (ref 70–139)
Glucose, Fasting: 101 mg/dL — ABNORMAL HIGH (ref 70–99)

## 2012-08-11 LAB — HIV ANTIBODY (ROUTINE TESTING W REFLEX): HIV: NONREACTIVE

## 2012-08-12 ENCOUNTER — Other Ambulatory Visit: Payer: Self-pay | Admitting: Obstetrics & Gynecology

## 2012-08-12 DIAGNOSIS — Z3401 Encounter for supervision of normal first pregnancy, first trimester: Secondary | ICD-10-CM

## 2012-08-25 ENCOUNTER — Ambulatory Visit (INDEPENDENT_AMBULATORY_CARE_PROVIDER_SITE_OTHER): Payer: Medicaid Other | Admitting: *Deleted

## 2012-08-25 DIAGNOSIS — O99810 Abnormal glucose complicating pregnancy: Secondary | ICD-10-CM

## 2012-08-25 NOTE — Progress Notes (Signed)
Per Dr Clearance Coots- patient needs ADA diet and diabetic teaching for gestational diabetes. Reviewed paperwork and diet. Rx for meter, lancets, test strips called to pharmacy. Code [phone] Patient to records levels and bring data to appointments.

## 2012-08-26 ENCOUNTER — Encounter: Payer: Self-pay | Admitting: Obstetrics & Gynecology

## 2012-08-26 ENCOUNTER — Ambulatory Visit (HOSPITAL_COMMUNITY)
Admission: RE | Admit: 2012-08-26 | Discharge: 2012-08-26 | Disposition: A | Discharge status: Home / Self Care | Payer: Medicaid Other | Source: Ambulatory Visit | Attending: Obstetrics & Gynecology | Admitting: Obstetrics & Gynecology

## 2012-08-26 DIAGNOSIS — O0990 Supervision of high risk pregnancy, unspecified, unspecified trimester: Secondary | ICD-10-CM

## 2012-08-26 DIAGNOSIS — Z3401 Encounter for supervision of normal first pregnancy, first trimester: Secondary | ICD-10-CM

## 2012-08-26 DIAGNOSIS — O09529 Supervision of elderly multigravida, unspecified trimester: Secondary | ICD-10-CM | POA: Insufficient documentation

## 2012-08-26 DIAGNOSIS — O99212 Obesity complicating pregnancy, second trimester: Secondary | ICD-10-CM

## 2012-08-26 DIAGNOSIS — Z3689 Encounter for other specified antenatal screening: Secondary | ICD-10-CM | POA: Insufficient documentation

## 2012-08-26 DIAGNOSIS — J45909 Unspecified asthma, uncomplicated: Secondary | ICD-10-CM

## 2012-08-26 DIAGNOSIS — G4733 Obstructive sleep apnea (adult) (pediatric): Secondary | ICD-10-CM

## 2012-08-26 DIAGNOSIS — E669 Obesity, unspecified: Secondary | ICD-10-CM | POA: Insufficient documentation

## 2012-08-26 DIAGNOSIS — O09892 Supervision of other high risk pregnancies, second trimester: Secondary | ICD-10-CM

## 2012-08-28 DIAGNOSIS — O24419 Gestational diabetes mellitus in pregnancy, unspecified control: Secondary | ICD-10-CM | POA: Insufficient documentation

## 2012-08-29 ENCOUNTER — Encounter: Payer: Self-pay | Admitting: Obstetrics & Gynecology

## 2012-09-09 ENCOUNTER — Ambulatory Visit (INDEPENDENT_AMBULATORY_CARE_PROVIDER_SITE_OTHER): Payer: Medicaid Other | Admitting: Obstetrics & Gynecology

## 2012-09-09 VITALS — BP 117/81 | Temp 98.9°F | Wt 258.0 lb

## 2012-09-09 DIAGNOSIS — Z3482 Encounter for supervision of other normal pregnancy, second trimester: Secondary | ICD-10-CM

## 2012-09-09 DIAGNOSIS — Z3480 Encounter for supervision of other normal pregnancy, unspecified trimester: Secondary | ICD-10-CM

## 2012-09-09 LAB — POCT URINALYSIS DIPSTICK
Glucose, UA: NEGATIVE
Ketones, UA: NEGATIVE
Urobilinogen, UA: NEGATIVE

## 2012-09-09 NOTE — Progress Notes (Signed)
P 88 Patient reports some hip pain

## 2012-09-11 ENCOUNTER — Encounter: Payer: Self-pay | Admitting: Obstetrics & Gynecology

## 2012-09-11 NOTE — Progress Notes (Signed)
CBGs OK 

## 2012-09-11 NOTE — Patient Instructions (Signed)
Pregnancy - Second Trimester The second trimester is the period between 13 to 27 weeks of your pregnancy. It is important to follow your doctor's instructions. HOME CARE   Do not smoke.  Do not drink alcohol or use drugs.  Only take medicine as told by your doctor.  Take prenatal vitamins as told. The vitamin should contain 1 milligram of folic acid.  Exercise.  Eat healthy foods. Eat regular, well-balanced meals.  You can have sex (intercourse) if there are no other problems with the pregnancy.  Do not use hot tubs, steam rooms, or saunas.  Wear a seat belt while driving.  Avoid raw meat, uncooked cheese, and litter boxes and soil used by cats.  Visit your dentist. Cleanings are okay. GET HELP RIGHT AWAY IF:   You have a temperature by mouth above 102 F (38.9 C), not controlled by medicine.  Fluid is coming from your vagina.  Blood is coming from your vagina. Light spotting is common, especially after sex (intercourse).  You have a bad smelling fluid (discharge) coming from the vagina. The fluid changes from clear to white.  You still feel sick to your stomach (nauseous).  You throw up (vomit) blood.  You lose or gain more than 2 pounds (0.9 kilograms) of weight in a week, or as suggested by your doctor.  Your face, hands, feet, or legs get puffy (swell).  You get exposed to German measles and have never had them.  You get exposed to fifth disease or chickenpox.  You have belly (abdominal) pain.  You have a bad headache that will not go away.  You have watery poop (diarrhea), pain when you pee (urinate), or have shortness of breath.  You start to have problems seeing (blurry or double vision).  You fall, are in a car accident, or have any kind of trauma.  There is mental or physical violence at home.  You have any concerns or worries during your pregnancy. MAKE SURE YOU:   Understand these instructions.  Will watch your condition.  Will get help  right away if you are not doing well or get worse. Document Released: 03/20/2009 Document Revised: 03/18/2011 Document Reviewed: 03/20/2009 Exit[employer] Patient Information 2014 [employer].  

## 2012-09-24 ENCOUNTER — Encounter: Payer: Self-pay | Admitting: Obstetrics & Gynecology

## 2012-09-24 ENCOUNTER — Ambulatory Visit (INDEPENDENT_AMBULATORY_CARE_PROVIDER_SITE_OTHER): Payer: Medicaid Other | Admitting: Obstetrics & Gynecology

## 2012-09-24 VITALS — Temp 98.4°F | Wt 258.6 lb

## 2012-09-24 DIAGNOSIS — Z3482 Encounter for supervision of other normal pregnancy, second trimester: Secondary | ICD-10-CM

## 2012-09-24 DIAGNOSIS — Z3480 Encounter for supervision of other normal pregnancy, unspecified trimester: Secondary | ICD-10-CM

## 2012-09-24 DIAGNOSIS — Z23 Encounter for immunization: Secondary | ICD-10-CM

## 2012-09-24 LAB — POCT URINALYSIS DIPSTICK
Bilirubin, UA: NEGATIVE
Nitrite, UA: NEGATIVE
pH, UA: 6

## 2012-09-24 NOTE — Progress Notes (Signed)
Pulse: 84

## 2012-09-25 ENCOUNTER — Encounter: Payer: Self-pay | Admitting: Obstetrics & Gynecology

## 2012-09-25 NOTE — Progress Notes (Signed)
CBGs in range.  Doing well.

## 2012-09-29 ENCOUNTER — Institutional Professional Consult (permissible substitution): Payer: Medicaid Other

## 2012-10-05 ENCOUNTER — Other Ambulatory Visit: Payer: Self-pay | Admitting: *Deleted

## 2012-10-05 DIAGNOSIS — O0990 Supervision of high risk pregnancy, unspecified, unspecified trimester: Secondary | ICD-10-CM

## 2012-10-07 ENCOUNTER — Ambulatory Visit: Payer: Medicare Other

## 2012-10-07 ENCOUNTER — Ambulatory Visit (INDEPENDENT_AMBULATORY_CARE_PROVIDER_SITE_OTHER): Payer: Medicaid Other | Admitting: Obstetrics & Gynecology

## 2012-10-07 VITALS — BP 128/84 | Temp 98.2°F | Wt 257.8 lb

## 2012-10-07 DIAGNOSIS — Z3482 Encounter for supervision of other normal pregnancy, second trimester: Secondary | ICD-10-CM

## 2012-10-07 DIAGNOSIS — Z3480 Encounter for supervision of other normal pregnancy, unspecified trimester: Secondary | ICD-10-CM

## 2012-10-07 LAB — POCT URINALYSIS DIPSTICK
Blood, UA: NEGATIVE
Nitrite, UA: NEGATIVE
Urobilinogen, UA: NEGATIVE
pH, UA: 6

## 2012-10-07 NOTE — Progress Notes (Signed)
Pulse- 91 CBGs in range.

## 2012-10-08 ENCOUNTER — Encounter: Payer: Self-pay | Admitting: Obstetrics & Gynecology

## 2012-10-13 ENCOUNTER — Institutional Professional Consult (permissible substitution): Payer: Medicaid Other

## 2012-10-21 ENCOUNTER — Encounter: Payer: Medicaid Other | Admitting: Obstetrics & Gynecology

## 2012-10-29 ENCOUNTER — Ambulatory Visit (INDEPENDENT_AMBULATORY_CARE_PROVIDER_SITE_OTHER): Payer: Medicaid Other | Admitting: Obstetrics & Gynecology

## 2012-10-29 VITALS — BP 123/86 | Temp 98.2°F | Wt 258.2 lb

## 2012-10-29 DIAGNOSIS — Z3482 Encounter for supervision of other normal pregnancy, second trimester: Secondary | ICD-10-CM

## 2012-10-29 DIAGNOSIS — Z3480 Encounter for supervision of other normal pregnancy, unspecified trimester: Secondary | ICD-10-CM

## 2012-10-29 LAB — POCT URINALYSIS DIPSTICK
Blood, UA: NEGATIVE
Spec Grav, UA: 1.015
Urobilinogen, UA: NEGATIVE

## 2012-10-29 NOTE — Progress Notes (Signed)
Pulse- 102 CBGs OK.  Doing well.

## 2012-10-30 ENCOUNTER — Other Ambulatory Visit: Payer: Self-pay | Admitting: *Deleted

## 2012-10-30 ENCOUNTER — Encounter: Payer: Self-pay | Admitting: Obstetrics & Gynecology

## 2012-10-30 DIAGNOSIS — O09899 Supervision of other high risk pregnancies, unspecified trimester: Secondary | ICD-10-CM

## 2012-11-03 ENCOUNTER — Encounter: Payer: Self-pay | Admitting: Obstetrics & Gynecology

## 2012-11-03 ENCOUNTER — Ambulatory Visit (INDEPENDENT_AMBULATORY_CARE_PROVIDER_SITE_OTHER): Payer: Medicaid Other

## 2012-11-03 DIAGNOSIS — O0990 Supervision of high risk pregnancy, unspecified, unspecified trimester: Secondary | ICD-10-CM

## 2012-11-03 LAB — US OB DETAIL + 14 WK

## 2012-11-12 ENCOUNTER — Ambulatory Visit (INDEPENDENT_AMBULATORY_CARE_PROVIDER_SITE_OTHER): Payer: Medicaid Other | Admitting: Obstetrics & Gynecology

## 2012-11-12 VITALS — BP 128/76 | Temp 98.1°F | Wt 258.0 lb

## 2012-11-12 DIAGNOSIS — Z3480 Encounter for supervision of other normal pregnancy, unspecified trimester: Secondary | ICD-10-CM

## 2012-11-12 DIAGNOSIS — Z3483 Encounter for supervision of other normal pregnancy, third trimester: Secondary | ICD-10-CM

## 2012-11-12 LAB — POCT URINALYSIS DIPSTICK
Bilirubin, UA: NEGATIVE
Blood, UA: NEGATIVE
Ketones, UA: NEGATIVE
pH, UA: 6

## 2012-11-12 NOTE — Progress Notes (Signed)
HR - 106 Pt in office for routine OB visit, reports lower abd pain, would like to know about [place] results.  CBGs in range.

## 2012-11-12 NOTE — Patient Instructions (Signed)
Nonstress Test The nonstress test is a procedure that monitors the fetus's heartbeat. The test will monitor the heartbeat when the fetus is at rest and while the fetus is moving. In a healthy fetus, there will be an increase in fetal heart rate when the fetus moves or kicks. The heart rate will decrease at rest. This test helps determine if the fetus is healthy. The test may take up to a half hour. Your caregiver will look at a number of patterns in the heart rate tracing to make sure your baby is thriving. If there is concern, your caregiver may order additional tests or may suggest another course of action. This test is often done in the third trimester and can help determine if an early delivery is needed and safe. Common reasons to have this test are:  You are past your due date.  You have a high-risk pregnancy.  You are feeling less movement than normal.  You have lost a pregnancy in the past.  Your caregiver suspects fetal growth problems.  You have too much or too little amniotic fluid. BEFORE THE PROCEDURE  Eat a meal right before the test or as directed by your caregiver. Food may help stimulate fetal movements.  Use the restroom right before the test. PROCEDURE  Two belts will be placed around your abdomen. These belts have monitors attached to them. One records the fetal heart rate and the other records uterine contractions.  You may be asked to lie down on your side or to stay sitting upright.  You may be given a button to press when you feel movement.  The fetal heartbeat is listened to and watched on a screen. The heartbeat is recorded on a sheet of paper.  If the fetus seems to be sleeping, you may be asked to drink some juice or soda, gently press your abdomen, or make some noise to wake the fetus. AFTER THE PROCEDURE  Your caregiver will discuss the test results with you and make recommendations for the near future. Document Released: 12/14/2001 Document Revised:  04/20/2012 Document Reviewed: 01/28/2012 Exit[employer] Patient Information 2014 [employer].  

## 2012-11-17 ENCOUNTER — Encounter: Payer: Self-pay | Admitting: Obstetrics & Gynecology

## 2012-11-19 ENCOUNTER — Encounter: Payer: Medicaid Other | Admitting: Obstetrics & Gynecology

## 2012-11-19 ENCOUNTER — Other Ambulatory Visit: Payer: Medicaid Other

## 2012-11-19 ENCOUNTER — Ambulatory Visit (INDEPENDENT_AMBULATORY_CARE_PROVIDER_SITE_OTHER): Payer: Medicaid Other | Admitting: Obstetrics & Gynecology

## 2012-11-19 VITALS — BP 125/88 | Temp 98.6°F | Wt 258.0 lb

## 2012-11-19 DIAGNOSIS — Z3483 Encounter for supervision of other normal pregnancy, third trimester: Secondary | ICD-10-CM

## 2012-11-19 DIAGNOSIS — O24419 Gestational diabetes mellitus in pregnancy, unspecified control: Secondary | ICD-10-CM

## 2012-11-19 DIAGNOSIS — Z3480 Encounter for supervision of other normal pregnancy, unspecified trimester: Secondary | ICD-10-CM

## 2012-11-19 DIAGNOSIS — O99810 Abnormal glucose complicating pregnancy: Secondary | ICD-10-CM

## 2012-11-19 LAB — POCT URINALYSIS DIPSTICK
Bilirubin, UA: NEGATIVE
Glucose, UA: NEGATIVE
Nitrite, UA: NEGATIVE

## 2012-11-19 NOTE — Progress Notes (Deleted)
CBG- 92.  Peak Flow 350 x 3.  ?checking CBGs.  Plans BTL.

## 2012-11-19 NOTE — Patient Instructions (Signed)
Third Trimester of Pregnancy  The third trimester is from week 29 through week 42, months 7 through 9. The third trimester is a time when the fetus is growing rapidly. At the end of the ninth month, the fetus is about 20 inches in length and weighs 6 10 pounds.   BODY CHANGES  Your body goes through many changes during pregnancy. The changes vary from woman to woman.    Your weight will continue to increase. You can expect to gain 25 35 pounds (11 16 kg) by the end of the pregnancy.   You may begin to get stretch marks on your hips, abdomen, and breasts.   You may urinate more often because the fetus is moving lower into your pelvis and pressing on your bladder.   You may develop or continue to have heartburn as a result of your pregnancy.   You may develop constipation because certain hormones are causing the muscles that push waste through your intestines to slow down.   You may develop hemorrhoids or swollen, bulging veins (varicose veins).   You may have pelvic pain because of the weight gain and pregnancy hormones relaxing your joints between the bones in your pelvis. Back aches may result from over exertion of the muscles supporting your posture.   Your breasts will continue to grow and be tender. A yellow discharge may leak from your breasts called colostrum.   Your belly button may stick out.   You may feel short of breath because of your expanding uterus.   You may notice the fetus "dropping," or moving lower in your abdomen.   You may have a bloody mucus discharge. This usually occurs a few days to a week before labor begins.   Your cervix becomes thin and soft (effaced) near your due date.  WHAT TO EXPECT AT YOUR PRENATAL EXAMS   You will have prenatal exams every 2 weeks until week 36. Then, you will have weekly prenatal exams. During a routine prenatal visit:   You will be weighed to make sure you and the fetus are growing normally.   Your blood pressure is taken.   Your abdomen will be  measured to track your baby's growth.   The fetal heartbeat will be listened to.   Any test results from the previous visit will be discussed.   You may have a cervical check near your due date to see if you have effaced.  At around 36 weeks, your caregiver will check your cervix. At the same time, your caregiver will also perform a test on the secretions of the vaginal tissue. This test is to determine if a type of bacteria, Group B streptococcus, is present. Your caregiver will explain this further.  Your caregiver may ask you:   What your birth plan is.   How you are feeling.   If you are feeling the baby move.   If you have had any abnormal symptoms, such as leaking fluid, bleeding, severe headaches, or abdominal cramping.   If you have any questions.  Other tests or screenings that may be performed during your third trimester include:   Blood tests that check for low iron levels (anemia).   Fetal testing to check the health, activity level, and growth of the fetus. Testing is done if you have certain medical conditions or if there are problems during the pregnancy.  FALSE LABOR  You may feel small, irregular contractions that eventually go away. These are called Braxton Hicks contractions, or   false labor. Contractions may last for hours, days, or even weeks before true labor sets in. If contractions come at regular intervals, intensify, or become painful, it is best to be seen by your caregiver.   SIGNS OF LABOR    Menstrual-like cramps.   Contractions that are 5 minutes apart or less.   Contractions that start on the top of the uterus and spread down to the lower abdomen and back.   A sense of increased pelvic pressure or back pain.   A watery or bloody mucus discharge that comes from the vagina.  If you have any of these signs before the 37th week of pregnancy, call your caregiver right away. You need to go to the hospital to get checked immediately.  HOME CARE INSTRUCTIONS    Avoid all  smoking, herbs, alcohol, and unprescribed drugs. These chemicals affect the formation and growth of the baby.   Follow your caregiver's instructions regarding medicine use. There are medicines that are either safe or unsafe to take during pregnancy.   Exercise only as directed by your caregiver. Experiencing uterine cramps is a good sign to stop exercising.   Continue to eat regular, healthy meals.   Wear a good support bra for breast tenderness.   Do not use hot tubs, steam rooms, or saunas.   Wear your seat belt at all times when driving.   Avoid raw meat, uncooked cheese, cat litter boxes, and soil used by cats. These carry germs that can cause birth defects in the baby.   Take your prenatal vitamins.   Try taking a stool softener (if your caregiver approves) if you develop constipation. Eat more high-fiber foods, such as fresh vegetables or fruit and whole grains. Drink plenty of fluids to keep your urine clear or pale yellow.   Take warm sitz baths to soothe any pain or discomfort caused by hemorrhoids. Use hemorrhoid cream if your caregiver approves.   If you develop varicose veins, wear support hose. Elevate your feet for 15 minutes, 3 4 times a day. Limit salt in your diet.   Avoid heavy lifting, wear low heal shoes, and practice good posture.   Rest a lot with your legs elevated if you have leg cramps or low back pain.   Visit your dentist if you have not gone during your pregnancy. Use a soft toothbrush to brush your teeth and be gentle when you floss.   A sexual relationship may be continued unless your caregiver directs you otherwise.   Do not travel far distances unless it is absolutely necessary and only with the approval of your caregiver.   Take prenatal classes to understand, practice, and ask questions about the labor and delivery.   Make a trial run to the hospital.   Pack your hospital bag.   Prepare the baby's nursery.   Continue to go to all your prenatal visits as directed  by your caregiver.  SEEK MEDICAL CARE IF:   You are unsure if you are in labor or if your water has broken.   You have dizziness.   You have mild pelvic cramps, pelvic pressure, or nagging pain in your abdominal area.   You have persistent nausea, vomiting, or diarrhea.   You have a bad smelling vaginal discharge.   You have pain with urination.  SEEK IMMEDIATE MEDICAL CARE IF:    You have a fever.   You are leaking fluid from your vagina.   You have spotting or bleeding from your vagina.     You have severe abdominal cramping or pain.   You have rapid weight loss or gain.   You have shortness of breath with chest pain.   You notice sudden or extreme swelling of your face, hands, ankles, feet, or legs.   You have not felt your baby move in over an hour.   You have severe headaches that do not go away with medicine.   You have vision changes.  Document Released: 12/18/2000 Document Revised: 08/26/2012 Document Reviewed: 02/25/2012  [employer] Patient Information 2014 [employer].

## 2012-11-19 NOTE — Progress Notes (Signed)
Pulse- 102 Pt states she is having lower abdomen pressure. Pt states she is having contractions in the evening.   Plans BTL.  ?checking CBGs.  Random CBG 92.

## 2012-11-26 ENCOUNTER — Encounter: Payer: Medicaid Other | Admitting: Obstetrics & Gynecology

## 2012-12-07 ENCOUNTER — Ambulatory Visit (INDEPENDENT_AMBULATORY_CARE_PROVIDER_SITE_OTHER): Payer: Medicare Other | Admitting: Obstetrics & Gynecology

## 2012-12-07 ENCOUNTER — Encounter: Payer: Self-pay | Admitting: Obstetrics & Gynecology

## 2012-12-07 VITALS — BP 135/88 | Temp 98.5°F | Wt 260.0 lb

## 2012-12-07 DIAGNOSIS — I82409 Acute embolism and thrombosis of unspecified deep veins of unspecified lower extremity: Secondary | ICD-10-CM

## 2012-12-07 DIAGNOSIS — Z3483 Encounter for supervision of other normal pregnancy, third trimester: Secondary | ICD-10-CM

## 2012-12-07 DIAGNOSIS — Z3480 Encounter for supervision of other normal pregnancy, unspecified trimester: Secondary | ICD-10-CM

## 2012-12-07 DIAGNOSIS — O99810 Abnormal glucose complicating pregnancy: Secondary | ICD-10-CM

## 2012-12-07 LAB — POCT URINALYSIS DIPSTICK
Glucose, UA: NEGATIVE
Nitrite, UA: NEGATIVE
Spec Grav, UA: <=1.005
Urobilinogen, UA: NEGATIVE

## 2012-12-07 MED ORDER — HEPARIN SODIUM (PORCINE) 10000 UNIT/ML IJ SOLN: 10000.0000 [IU] | Freq: Two times a day (BID) | INTRAMUSCULAR | Status: DC

## 2012-12-07 NOTE — Addendum Note (Signed)
Addended by: Elby Beck F on: 12/07/2012 04:58 PM   Modules accepted: Orders

## 2012-12-07 NOTE — Progress Notes (Signed)
Pulse 109  Pt is doing well.  CBGs OK.  Transition-->Heparin.

## 2012-12-07 NOTE — Patient Instructions (Addendum)
Heparin 10,000 units twice a day subcutaneously Return day 3, day 7 and day 14 for CBC- Blood workPatient information: Group B streptococcus and pregnancy (Beyond the Basics)  Authors Doylene Canning, MD, PhD Verne Carrow, MD Section Editors Thayer Jew, MD Bronson Ing, MD Deputy Editor Raye Sorrow, MD Disclosures  All topics are updated as new evidence becomes available and our peer review process is complete.  Literature review current through: Feb 2014.  This topic last updated: Jul 08, 2011.  INTRODUCTION - Group B streptococcus (GBS) is a bacterium that can cause serious infections in pregnant women and newborn babies. GBS is one of many types of streptococcal bacteria, sometimes called "strep." This article discusses GBS, its effect on pregnant women and infants, and ways to prevent complications of GBS. More detailed information about GBS is available by subscription. (See "Group B streptococcal infection in pregnant women".) WHAT IS GROUP B STREP INFECTION? - GBS is commonly found in the digestive system and the vagina. In healthy adults, GBS is not harmful and does not cause problems. But in pregnant women and newborn infants, being infected with GBS can cause serious illness. Approximately one in three to four pregnant women in the [place] carries GBS in their gastrointestinal system and/or in their vagina. Carrying GBS is not the same as being infected. Carriers are not sick and do not need treatment during pregnancy. There is no treatment that can stop you from carrying GBS.  Pregnant women who are carriers of GBS infrequently become infected with GBS. GBS can cause urinary tract infections, infection of the amniotic fluid (bag of water), and infection of the uterus after delivery. GBS infections during pregnancy may lead to preterm labor.  Pregnant women who carry GBS can pass on the bacteria to their newborns, and some of those babies become infected with GBS. Newborns who  are infected with GBS can develop pneumonia (lung infection), septicemia (blood infection), or meningitis (infection of the lining of the brain and spinal cord). These complications can be prevented by giving intravenous antibiotics during labor to any woman who is at risk of GBS infection. You are at risk of GBS infection if: You have a urine culture during your current pregnancy showing GBS  You have a vaginal and rectal culture during your current pregnancy showing GBS  You had an infant infected with GBS in the past GROUP B STREP PREVENTION - Most doctors and nurses recommend a urine culture early in your pregnancy to be sure that you do not have a bladder infection without symptoms. If you urine culture shows GBS or other bacteria, you may be treated with an antibiotic. If you have symptoms of urinary infection, such as pain with urination, any time during your pregnancy, a urine culture is done. If GBS grows from the urine culture, it should be treated with an antibiotic, and you should also receive intravenous antibiotics during labor. Expert groups recommend that all pregnant women have a GBS culture at 35 to 37 weeks of pregnancy. The culture is done by swabbing the vagina and rectum. If your GBS culture is positive, you will be given an intravenous antibiotic during labor. If you have preterm labor, the culture is done then and an intravenous antibiotic is given until the baby is born or the labor is stopped by your health care provider. If you have a positive GBS culture and you have an allergy to penicillin, be sure your doctor and nurse are aware of this  allergy and tell them what happened with the allergy. If you had only a rash or itching, this is not a serious allergy, and you can receive a common drug related to the penicillin. If you had a serious allergy (for example, trouble breathing, swelling of your face) you may need an additional test to determine which antibiotic should be used  during labor. Being treated with an antibiotic during labor greatly reduces the chance that you or your newborn will develop infections related to GBS. It is important to note that young infants up to age 78 months can also develop septicemia, meningitis and other serious infections from GBS. Being treated with an antibiotic during labor does not reduce the chance that your baby will develop this later type of infection. There is currently no known way of preventing this later-onset GBS disease. WHERE TO GET MORE INFORMATION - Your healthcare provider is the best source of information for questions and concerns related to your medical problem.

## 2012-12-08 ENCOUNTER — Ambulatory Visit (INDEPENDENT_AMBULATORY_CARE_PROVIDER_SITE_OTHER): Payer: Medicare Other

## 2012-12-08 ENCOUNTER — Other Ambulatory Visit: Payer: Self-pay | Admitting: Obstetrics & Gynecology

## 2012-12-08 DIAGNOSIS — O09523 Supervision of elderly multigravida, third trimester: Secondary | ICD-10-CM

## 2012-12-08 DIAGNOSIS — O0990 Supervision of high risk pregnancy, unspecified, unspecified trimester: Secondary | ICD-10-CM

## 2012-12-08 DIAGNOSIS — O365990 Maternal care for other known or suspected poor fetal growth, unspecified trimester, not applicable or unspecified: Secondary | ICD-10-CM

## 2012-12-08 DIAGNOSIS — O09899 Supervision of other high risk pregnancies, unspecified trimester: Secondary | ICD-10-CM

## 2012-12-09 ENCOUNTER — Encounter: Payer: Self-pay | Admitting: Obstetrics & Gynecology

## 2012-12-09 LAB — US OB DETAIL + 14 WK

## 2012-12-14 ENCOUNTER — Ambulatory Visit (INDEPENDENT_AMBULATORY_CARE_PROVIDER_SITE_OTHER): Payer: Medicare Other | Admitting: Obstetrics & Gynecology

## 2012-12-14 VITALS — BP 131/77 | Temp 99.9°F | Wt 257.0 lb

## 2012-12-14 DIAGNOSIS — Z3480 Encounter for supervision of other normal pregnancy, unspecified trimester: Secondary | ICD-10-CM

## 2012-12-14 DIAGNOSIS — O09899 Supervision of other high risk pregnancies, unspecified trimester: Secondary | ICD-10-CM

## 2012-12-14 DIAGNOSIS — O99810 Abnormal glucose complicating pregnancy: Secondary | ICD-10-CM

## 2012-12-14 DIAGNOSIS — Z22330 Carrier of Group B streptococcus: Secondary | ICD-10-CM

## 2012-12-14 DIAGNOSIS — O0993 Supervision of high risk pregnancy, unspecified, third trimester: Secondary | ICD-10-CM

## 2012-12-14 DIAGNOSIS — O0990 Supervision of high risk pregnancy, unspecified, unspecified trimester: Secondary | ICD-10-CM

## 2012-12-14 DIAGNOSIS — I82409 Acute embolism and thrombosis of unspecified deep veins of unspecified lower extremity: Secondary | ICD-10-CM

## 2012-12-14 DIAGNOSIS — Z3483 Encounter for supervision of other normal pregnancy, third trimester: Secondary | ICD-10-CM

## 2012-12-14 DIAGNOSIS — O99820 Streptococcus B carrier state complicating pregnancy: Secondary | ICD-10-CM | POA: Insufficient documentation

## 2012-12-14 LAB — CBC WITH DIFFERENTIAL/PLATELET
Basophils Absolute: 0 10*3/uL (ref 0.0–0.1)
Basophils Relative: 0 % (ref 0–1)
Eosinophils Absolute: 0.1 10*3/uL (ref 0.0–0.7)
Eosinophils Relative: 1 % (ref 0–5)
Lymphocytes Relative: 8 % — ABNORMAL LOW (ref 12–46)
MCHC: 34.8 g/dL (ref 30.0–36.0)
MCV: 83.1 fL (ref 78.0–100.0)
Monocytes Relative: 8 % (ref 3–12)
Neutro Abs: 11.4 10*3/uL — ABNORMAL HIGH (ref 1.7–7.7)
Platelets: 227 10*3/uL (ref 150–400)
RDW: 13.5 % (ref 11.5–15.5)
WBC: 13.8 10*3/uL — ABNORMAL HIGH (ref 4.0–10.5)

## 2012-12-14 LAB — POCT URINALYSIS DIPSTICK
Bilirubin, UA: NEGATIVE
Glucose, UA: NEGATIVE
Nitrite, UA: NEGATIVE
Urobilinogen, UA: NEGATIVE
pH, UA: 7.5

## 2012-12-14 MED ORDER — NITROFURANTOIN MONOHYD MACRO 100 MG PO CAPS: 100.0000 mg | ORAL_CAPSULE | Freq: Two times a day (BID) | ORAL | Status: DC

## 2012-12-14 NOTE — Progress Notes (Signed)
Pulse 122 Pt states that she was in [place] on Saturday with shaking and vomiting.  She was given Cipro for UTI and was given IV fluids for dehydration.  Pt states that she is also having some bleeding since last night when at bathroom. CBGs in range.  BPP tomorrow.  D/C Cipro.  CBC today.

## 2012-12-14 NOTE — Patient Instructions (Signed)
Fetal Movement Counts Patient Name: __________________________________________________ Patient Due Date: ____________________ Performing a fetal movement count is highly recommended in high-risk pregnancies, but it is good for every pregnant woman to do. Your caregiver may ask you to start counting fetal movements at 28 weeks of the pregnancy. Fetal movements often increase:  After eating a full meal.  After physical activity.  After eating or drinking something sweet or cold.  At rest. Pay attention to when you feel the baby is most active. This will help you notice a pattern of your baby's sleep and wake cycles and what factors contribute to an increase in fetal movement. It is important to perform a fetal movement count at the same time each day when your baby is normally most active.  HOW TO COUNT FETAL MOVEMENTS 1. Find a quiet and comfortable area to sit or lie down on your left side. Lying on your left side provides the best blood and oxygen circulation to your baby. 2. Write down the day and time on a sheet of paper or in a journal. 3. Start counting kicks, flutters, swishes, rolls, or jabs in a 2 hour period. You should feel at least 10 movements within 2 hours. 4. If you do not feel 10 movements in 2 hours, wait 2 3 hours and count again. Look for a change in the pattern or not enough counts in 2 hours. SEEK MEDICAL CARE IF:  You feel less than 10 counts in 2 hours, tried twice.  There is no movement in over an hour.  The pattern is changing or taking longer each day to reach 10 counts in 2 hours.  You feel the baby is not moving as he or she usually does. Date: ____________ Movements: ____________ Start time: ____________ Finish time: ____________  Date: ____________ Movements: ____________ Start time: ____________ Finish time: ____________ Date: ____________ Movements: ____________ Start time: ____________ Finish time: ____________ Date: ____________ Movements: ____________  Start time: ____________ Finish time: ____________ Date: ____________ Movements: ____________ Start time: ____________ Finish time: ____________ Date: ____________ Movements: ____________ Start time: ____________ Finish time: ____________ Date: ____________ Movements: ____________ Start time: ____________ Finish time: ____________ Date: ____________ Movements: ____________ Start time: ____________ Finish time: ____________  Date: ____________ Movements: ____________ Start time: ____________ Finish time: ____________ Date: ____________ Movements: ____________ Start time: ____________ Finish time: ____________ Date: ____________ Movements: ____________ Start time: ____________ Finish time: ____________ Date: ____________ Movements: ____________ Start time: ____________ Finish time: ____________ Date: ____________ Movements: ____________ Start time: ____________ Finish time: ____________ Date: ____________ Movements: ____________ Start time: ____________ Finish time: ____________ Date: ____________ Movements: ____________ Start time: ____________ Finish time: ____________  Date: ____________ Movements: ____________ Start time: ____________ Finish time: ____________ Date: ____________ Movements: ____________ Start time: ____________ Finish time: ____________ Date: ____________ Movements: ____________ Start time: ____________ Finish time: ____________ Date: ____________ Movements: ____________ Start time: ____________ Finish time: ____________ Date: ____________ Movements: ____________ Start time: ____________ Finish time: ____________ Date: ____________ Movements: ____________ Start time: ____________ Finish time: ____________ Date: ____________ Movements: ____________ Start time: ____________ Finish time: ____________  Date: ____________ Movements: ____________ Start time: ____________ Finish time: ____________ Date: ____________ Movements: ____________ Start time: ____________ Finish time:  ____________ Date: ____________ Movements: ____________ Start time: ____________ Finish time: ____________ Date: ____________ Movements: ____________ Start time: ____________ Finish time: ____________ Date: ____________ Movements: ____________ Start time: ____________ Finish time: ____________ Date: ____________ Movements: ____________ Start time: ____________ Finish time: ____________ Date: ____________ Movements: ____________ Start time: ____________ Finish time: ____________  Date: ____________ Movements: ____________ Start time: ____________ Finish   time: ____________ Date: ____________ Movements: ____________ Start time: ____________ Finish time: ____________ Date: ____________ Movements: ____________ Start time: ____________ Finish time: ____________ Date: ____________ Movements: ____________ Start time: ____________ Finish time: ____________ Date: ____________ Movements: ____________ Start time: ____________ Finish time: ____________ Date: ____________ Movements: ____________ Start time: ____________ Finish time: ____________ Date: ____________ Movements: ____________ Start time: ____________ Finish time: ____________  Date: ____________ Movements: ____________ Start time: ____________ Finish time: ____________ Date: ____________ Movements: ____________ Start time: ____________ Finish time: ____________ Date: ____________ Movements: ____________ Start time: ____________ Finish time: ____________ Date: ____________ Movements: ____________ Start time: ____________ Finish time: ____________ Date: ____________ Movements: ____________ Start time: ____________ Finish time: ____________ Date: ____________ Movements: ____________ Start time: ____________ Finish time: ____________ Date: ____________ Movements: ____________ Start time: ____________ Finish time: ____________  Date: ____________ Movements: ____________ Start time: ____________ Finish time: ____________ Date: ____________ Movements:  ____________ Start time: ____________ Finish time: ____________ Date: ____________ Movements: ____________ Start time: ____________ Finish time: ____________ Date: ____________ Movements: ____________ Start time: ____________ Finish time: ____________ Date: ____________ Movements: ____________ Start time: ____________ Finish time: ____________ Date: ____________ Movements: ____________ Start time: ____________ Finish time: ____________ Date: ____________ Movements: ____________ Start time: ____________ Finish time: ____________  Date: ____________ Movements: ____________ Start time: ____________ Finish time: ____________ Date: ____________ Movements: ____________ Start time: ____________ Finish time: ____________ Date: ____________ Movements: ____________ Start time: ____________ Finish time: ____________ Date: ____________ Movements: ____________ Start time: ____________ Finish time: ____________ Date: ____________ Movements: ____________ Start time: ____________ Finish time: ____________ Date: ____________ Movements: ____________ Start time: ____________ Finish time: ____________ Document Released: 01/23/2006 Document Revised: 12/11/2011 Document Reviewed: 10/21/2011 [employer] Patient Information 2014 [employer].  

## 2012-12-15 ENCOUNTER — Ambulatory Visit (INDEPENDENT_AMBULATORY_CARE_PROVIDER_SITE_OTHER): Payer: Medicare Other | Admitting: Advanced Practice Midwife

## 2012-12-15 ENCOUNTER — Other Ambulatory Visit: Payer: Self-pay | Admitting: Obstetrics & Gynecology

## 2012-12-15 ENCOUNTER — Ambulatory Visit (INDEPENDENT_AMBULATORY_CARE_PROVIDER_SITE_OTHER): Payer: Medicare Other

## 2012-12-15 DIAGNOSIS — O09523 Supervision of elderly multigravida, third trimester: Secondary | ICD-10-CM

## 2012-12-15 DIAGNOSIS — O368190 Decreased fetal movements, unspecified trimester, not applicable or unspecified: Secondary | ICD-10-CM

## 2012-12-15 DIAGNOSIS — O368131 Decreased fetal movements, third trimester, fetus 1: Secondary | ICD-10-CM

## 2012-12-15 DIAGNOSIS — O0990 Supervision of high risk pregnancy, unspecified, unspecified trimester: Secondary | ICD-10-CM

## 2012-12-15 DIAGNOSIS — O99810 Abnormal glucose complicating pregnancy: Secondary | ICD-10-CM

## 2012-12-15 DIAGNOSIS — O09519 Supervision of elderly primigravida, unspecified trimester: Secondary | ICD-10-CM

## 2012-12-15 NOTE — Progress Notes (Signed)
Subjective: Patricia Fitzgerald is a 37 y.o. at 42 weeks   Patient denies vaginal leaking of fluid or bleeding, denies contractions.  Reports positive fetal movment.  Denies concerns today.Patient reports she is hydrating, no concerns of fetal movement of leaking of amniotic fluid. Reports she had a very scant amount of blood tinged discharge today. Denies pain.   Objective: There were no vitals filed for this visit. 135 FHR + Accel, Negative Decel, Moderate Variability Toco: Negative Abdomen: Soft  BPP done today, found AFI 5.7  Assessment: Patient Active Problem List   Diagnosis Date Noted  . GBS (group B Streptococcus carrier), +RV culture, currently pregnant 12/14/2012  . Gestational diabetes 08/28/2012  . Obstructive sleep apnea 08/07/2012  . Asthma, chronic 08/07/2012  . Elderly multigravida unspecified as to episode of care or not applicable 07/27/2012  . Personal history of other genital system and obstetric disorders(V13.29) 07/27/2012  . Maternal mental disorder, antepartum 07/27/2012  . Obesity complicating pregnancy in second trimester 07/13/2012  . Unspecified high-risk pregnancy 07/13/2012  . Pregnancy complicated by previous DVT 07/13/2012  . Advanced maternal age (AMA) in pregnancy 07/13/2012  Oligohydraminos Reactive NST  Plan: Patient to return to clinic in next week Patient to see MFM tomorrow for plan of care for delivery due to low fluid level and high risk pregnancy. Reviewed warning signs in pregnancy. Patient to call with concerns PRN. Reviewed triage location. MD Clearance Coots aware and agrees to plan.   Sarim Rothman Wilson Singer CNM

## 2012-12-16 ENCOUNTER — Encounter (HOSPITAL_COMMUNITY): Payer: Self-pay

## 2012-12-16 ENCOUNTER — Ambulatory Visit (HOSPITAL_COMMUNITY): Admission: RE | Admit: 2012-12-16 | Payer: Medicare Other | Source: Ambulatory Visit

## 2012-12-16 ENCOUNTER — Ambulatory Visit (HOSPITAL_COMMUNITY)
Admission: RE | Admit: 2012-12-16 | Discharge: 2012-12-16 | Disposition: A | Discharge status: Home / Self Care | Payer: Medicare Other | Source: Ambulatory Visit | Attending: Obstetrics | Admitting: Obstetrics

## 2012-12-16 ENCOUNTER — Other Ambulatory Visit: Payer: Self-pay | Admitting: Obstetrics & Gynecology

## 2012-12-16 ENCOUNTER — Other Ambulatory Visit: Payer: Medicare Other

## 2012-12-16 ENCOUNTER — Other Ambulatory Visit: Payer: Self-pay | Admitting: Obstetrics

## 2012-12-16 VITALS — BP 139/82 | HR 118 | Wt 253.0 lb

## 2012-12-16 DIAGNOSIS — O09529 Supervision of elderly multigravida, unspecified trimester: Secondary | ICD-10-CM | POA: Insufficient documentation

## 2012-12-16 DIAGNOSIS — O99212 Obesity complicating pregnancy, second trimester: Secondary | ICD-10-CM

## 2012-12-16 DIAGNOSIS — I82409 Acute embolism and thrombosis of unspecified deep veins of unspecified lower extremity: Secondary | ICD-10-CM

## 2012-12-16 DIAGNOSIS — O0990 Supervision of high risk pregnancy, unspecified, unspecified trimester: Secondary | ICD-10-CM

## 2012-12-16 DIAGNOSIS — O4190X1 Disorder of amniotic fluid and membranes, unspecified, unspecified trimester, fetus 1: Secondary | ICD-10-CM

## 2012-12-16 DIAGNOSIS — O4100X0 Oligohydramnios, unspecified trimester, not applicable or unspecified: Secondary | ICD-10-CM | POA: Insufficient documentation

## 2012-12-16 DIAGNOSIS — J45909 Unspecified asthma, uncomplicated: Secondary | ICD-10-CM

## 2012-12-16 DIAGNOSIS — G4733 Obstructive sleep apnea (adult) (pediatric): Secondary | ICD-10-CM

## 2012-12-16 DIAGNOSIS — O24419 Gestational diabetes mellitus in pregnancy, unspecified control: Secondary | ICD-10-CM

## 2012-12-16 DIAGNOSIS — E669 Obesity, unspecified: Secondary | ICD-10-CM | POA: Insufficient documentation

## 2012-12-16 DIAGNOSIS — O99820 Streptococcus B carrier state complicating pregnancy: Secondary | ICD-10-CM

## 2012-12-16 NOTE — ED Notes (Signed)
Patient states she has noticed decreased movement this week and has been evaluated twice in the office. She has noticed VB starting Sunday and stopped today. She has noticed no LOF. Patient declined all genetic screening.

## 2012-12-17 LAB — CBC WITH DIFFERENTIAL/PLATELET
Basophils Absolute: 0.1 10*3/uL (ref 0.0–0.1)
Basophils Relative: 1 % (ref 0–1)
Eosinophils Absolute: 0.1 10*3/uL (ref 0.0–0.7)
Hemoglobin: 11.2 g/dL — ABNORMAL LOW (ref 12.0–15.0)
MCH: 29.1 pg (ref 26.0–34.0)
MCHC: 33.7 g/dL (ref 30.0–36.0)
Monocytes Relative: 9 % (ref 3–12)
Neutro Abs: 5.7 10*3/uL (ref 1.7–7.7)
Neutrophils Relative %: 66 % (ref 43–77)
Platelets: 231 10*3/uL (ref 150–400)

## 2012-12-21 ENCOUNTER — Ambulatory Visit (INDEPENDENT_AMBULATORY_CARE_PROVIDER_SITE_OTHER): Payer: Medicare Other | Admitting: Obstetrics & Gynecology

## 2012-12-21 VITALS — BP 135/90 | Temp 97.7°F | Wt 261.0 lb

## 2012-12-21 DIAGNOSIS — O99810 Abnormal glucose complicating pregnancy: Secondary | ICD-10-CM

## 2012-12-21 DIAGNOSIS — I82409 Acute embolism and thrombosis of unspecified deep veins of unspecified lower extremity: Secondary | ICD-10-CM

## 2012-12-21 DIAGNOSIS — Z3480 Encounter for supervision of other normal pregnancy, unspecified trimester: Secondary | ICD-10-CM

## 2012-12-21 DIAGNOSIS — Z3483 Encounter for supervision of other normal pregnancy, third trimester: Secondary | ICD-10-CM

## 2012-12-21 LAB — CBC WITH DIFFERENTIAL/PLATELET
HCT: 33.4 % — ABNORMAL LOW (ref 36.0–46.0)
Hemoglobin: 11.3 g/dL — ABNORMAL LOW (ref 12.0–15.0)
Lymphocytes Relative: 25 % (ref 12–46)
Lymphs Abs: 3.3 10*3/uL (ref 0.7–4.0)
Monocytes Relative: 6 % (ref 3–12)
Neutro Abs: 9.1 10*3/uL — ABNORMAL HIGH (ref 1.7–7.7)
Neutrophils Relative %: 68 % (ref 43–77)
Platelets: 275 10*3/uL (ref 150–400)
RBC: 3.93 MIL/uL (ref 3.87–5.11)
WBC: 13.3 10*3/uL — ABNORMAL HIGH (ref 4.0–10.5)

## 2012-12-21 LAB — POCT URINALYSIS DIPSTICK
Bilirubin, UA: NEGATIVE
Blood, UA: NEGATIVE
Glucose, UA: NEGATIVE
Nitrite, UA: NEGATIVE
Spec Grav, UA: <=1.005
Urobilinogen, UA: NEGATIVE

## 2012-12-21 NOTE — Progress Notes (Signed)
Pulse 109 Pt states that pregnancy is going well.  CBGs OK.

## 2012-12-28 ENCOUNTER — Ambulatory Visit (HOSPITAL_COMMUNITY)
Admission: RE | Admit: 2012-12-28 | Discharge: 2012-12-28 | Disposition: A | Discharge status: Home / Self Care | Payer: Medicare Other | Source: Ambulatory Visit | Attending: Obstetrics & Gynecology | Admitting: Obstetrics & Gynecology

## 2012-12-28 ENCOUNTER — Other Ambulatory Visit: Payer: Self-pay | Admitting: *Deleted

## 2012-12-28 ENCOUNTER — Ambulatory Visit (INDEPENDENT_AMBULATORY_CARE_PROVIDER_SITE_OTHER): Payer: Medicare Other | Admitting: Obstetrics & Gynecology

## 2012-12-28 ENCOUNTER — Other Ambulatory Visit: Payer: Self-pay | Admitting: Obstetrics & Gynecology

## 2012-12-28 VITALS — BP 132/83 | Temp 98.6°F | Wt 262.0 lb

## 2012-12-28 DIAGNOSIS — O99810 Abnormal glucose complicating pregnancy: Secondary | ICD-10-CM | POA: Insufficient documentation

## 2012-12-28 DIAGNOSIS — O288 Other abnormal findings on antenatal screening of mother: Secondary | ICD-10-CM

## 2012-12-28 DIAGNOSIS — O09529 Supervision of elderly multigravida, unspecified trimester: Secondary | ICD-10-CM | POA: Insufficient documentation

## 2012-12-28 DIAGNOSIS — O289 Unspecified abnormal findings on antenatal screening of mother: Secondary | ICD-10-CM | POA: Insufficient documentation

## 2012-12-28 DIAGNOSIS — E669 Obesity, unspecified: Secondary | ICD-10-CM | POA: Insufficient documentation

## 2012-12-28 DIAGNOSIS — Z3480 Encounter for supervision of other normal pregnancy, unspecified trimester: Secondary | ICD-10-CM

## 2012-12-28 LAB — POCT URINALYSIS DIPSTICK
Bilirubin, UA: NEGATIVE
Glucose, UA: NEGATIVE
Ketones, UA: NEGATIVE
Nitrite, UA: NEGATIVE
Urobilinogen, UA: NEGATIVE
pH, UA: 7

## 2012-12-28 NOTE — Progress Notes (Signed)
P 118 Patient has sporadic contractions. Patient reports she thinks she is starting a yeast infection.  CBGs OK.

## 2012-12-29 ENCOUNTER — Inpatient Hospital Stay (HOSPITAL_COMMUNITY)
Admission: AD | Admit: 2012-12-29 | Discharge: 2012-12-29 | Disposition: A | Discharge status: Home / Self Care | Payer: Medicaid Other | Source: Ambulatory Visit | Attending: Obstetrics | Admitting: Obstetrics

## 2012-12-29 ENCOUNTER — Encounter (HOSPITAL_COMMUNITY): Payer: Self-pay | Admitting: *Deleted

## 2012-12-29 DIAGNOSIS — O479 False labor, unspecified: Secondary | ICD-10-CM | POA: Insufficient documentation

## 2012-12-29 DIAGNOSIS — Z87891 Personal history of nicotine dependence: Secondary | ICD-10-CM | POA: Insufficient documentation

## 2012-12-29 LAB — POCT FERN TEST: POCT Fern Test: NEGATIVE

## 2012-12-29 LAB — WET PREP, GENITAL: Trich, Wet Prep: NONE SEEN

## 2012-12-29 NOTE — MAU Provider Note (Signed)
  History     CSN: [id number]  Arrival date and time: 12/29/12 1859   First Provider Initiated Contact with Patient 12/29/12 1920      Chief Complaint  Patient presents with  . Contractions   HPI  Pt is [redacted]w[redacted]d pregnant with ?ROM at 6 pm today when underwear was soaked.   Past Medical History  Diagnosis Date  . Depression   . Arthritis   . PCOS (polycystic ovarian syndrome)   . Post traumatic stress disorder (PTSD)   . Inflammatory bowel diseases (IBD)   . PTSD (post-traumatic stress disorder)   . UTI (lower urinary tract infection)   . IBS (irritable bowel syndrome)   . DVT (deep vein thrombosis) in pregnancy     left leg.  . Asthma   . PTSD (post-traumatic stress disorder)     Past Surgical History  Procedure Laterality Date  . Wisdom tooth extraction      Family History  Problem Relation Age of Onset  . Diabetes Mother   . Congestive Heart Failure Mother   . Diabetes Father   . Diabetes Brother   . Cancer Maternal Grandmother   . Heart disease Paternal Grandmother     History  Substance Use Topics  . Smoking status: Former Smoker -- 0.25 packs/day for .5 years    Types: Cigarettes    Quit date: 06/30/2002  . Smokeless tobacco: Not on file  . Alcohol Use: No    Allergies: No Known Allergies  Prescriptions prior to admission  Medication Sig Dispense Refill  . acetaminophen (TYLENOL) 500 MG tablet Take 1 tablet (500 mg total) by mouth every 6 (six) hours as needed for pain.  30 tablet  0  . heparin 40981 UNIT/ML injection Inject 1 mL (10,000 Units total) into the skin 2 (two) times daily.  60 mL  5  . nitrofurantoin, macrocrystal-monohydrate, (MACROBID) 100 MG capsule Take 1 capsule (100 mg total) by mouth 2 (two) times daily.  14 capsule  0  . Prenatal Vit-Fe Fumarate-FA (MULTIVITAMIN-PRENATAL) 27-0.8 MG TABS tablet Take 1 tablet by mouth daily at 12 noon.        ROS Physical Exam   Blood pressure 126/78, pulse 118, temperature 97.4 F (36.3 C),  temperature source Oral, resp. rate 18, height 5\' 4"  (1.626 m), weight 118.933 kg (262 lb 3.2 oz), last menstrual period 03/31/2012, SpO2 100.00%.  Physical Exam  Vitals reviewed. Constitutional: She is oriented to person, place, and time. She appears well-developed and well-nourished.  HENT:  Head: Normocephalic.  Eyes: Pupils are equal, round, and reactive to light.  Neck: Normal range of motion.  Cardiovascular: Normal rate.   Respiratory: Effort normal.  GI: Soft.  FHR reactive  Genitourinary:  Vagina-mod amount of yellow frothy discharge in vault; cervix small amount of bright red blood noted; cervix soft 50% 3 cm vertex -2 station  Musculoskeletal: Normal range of motion.  Neurological: She is alert and oriented to person, place, and time.  Skin: Skin is warm and dry.  Psychiatric: She has a normal mood and affect.    MAU Course  Procedures Fern test- neg Dr. Clearance Coots informed per RN   Assessment and Plan   LINEBERRY,SUSAN 12/29/2012, 7:20 PM

## 2012-12-29 NOTE — MAU Note (Addendum)
Patient presents to MAU with c/o contractions every 6 minutes. States felt leaking of fluid on way to hospital; states leaked through to pants. Denies vaginal bleeding. Reports good fetal movement. States lost mucus plug this am.

## 2012-12-30 ENCOUNTER — Encounter (HOSPITAL_COMMUNITY): Payer: Medicaid Other | Admitting: Anesthesiology

## 2012-12-30 ENCOUNTER — Encounter (HOSPITAL_COMMUNITY): Payer: Self-pay | Admitting: *Deleted

## 2012-12-30 ENCOUNTER — Inpatient Hospital Stay (HOSPITAL_COMMUNITY): Payer: Medicaid Other | Admitting: Anesthesiology

## 2012-12-30 ENCOUNTER — Inpatient Hospital Stay (HOSPITAL_COMMUNITY)
Admission: AD | Admit: 2012-12-30 | Discharge: 2012-12-31 | DRG: 775 | Disposition: A | Discharge status: Home / Self Care | Payer: Medicaid Other | Source: Ambulatory Visit | Attending: Obstetrics | Admitting: Obstetrics

## 2012-12-30 DIAGNOSIS — O99820 Streptococcus B carrier state complicating pregnancy: Secondary | ICD-10-CM

## 2012-12-30 DIAGNOSIS — O24419 Gestational diabetes mellitus in pregnancy, unspecified control: Secondary | ICD-10-CM

## 2012-12-30 DIAGNOSIS — O09893 Supervision of other high risk pregnancies, third trimester: Secondary | ICD-10-CM

## 2012-12-30 DIAGNOSIS — Z86718 Personal history of other venous thrombosis and embolism: Secondary | ICD-10-CM

## 2012-12-30 DIAGNOSIS — O09529 Supervision of elderly multigravida, unspecified trimester: Secondary | ICD-10-CM | POA: Diagnosis present

## 2012-12-30 DIAGNOSIS — O99892 Other specified diseases and conditions complicating childbirth: Secondary | ICD-10-CM | POA: Diagnosis present

## 2012-12-30 DIAGNOSIS — J45909 Unspecified asthma, uncomplicated: Secondary | ICD-10-CM

## 2012-12-30 DIAGNOSIS — G4733 Obstructive sleep apnea (adult) (pediatric): Secondary | ICD-10-CM

## 2012-12-30 DIAGNOSIS — O99212 Obesity complicating pregnancy, second trimester: Secondary | ICD-10-CM

## 2012-12-30 DIAGNOSIS — Z22330 Carrier of Group B streptococcus: Secondary | ICD-10-CM

## 2012-12-30 DIAGNOSIS — O99814 Abnormal glucose complicating childbirth: Principal | ICD-10-CM | POA: Diagnosis present

## 2012-12-30 DIAGNOSIS — Z87891 Personal history of nicotine dependence: Secondary | ICD-10-CM

## 2012-12-30 DIAGNOSIS — O0990 Supervision of high risk pregnancy, unspecified, unspecified trimester: Secondary | ICD-10-CM

## 2012-12-30 LAB — CBC
HCT: 32.8 % — ABNORMAL LOW (ref 36.0–46.0)
MCH: 29.5 pg (ref 26.0–34.0)
MCV: 86.3 fL (ref 78.0–100.0)
RBC: 3.8 MIL/uL — ABNORMAL LOW (ref 3.87–5.11)
RDW: 13.5 % (ref 11.5–15.5)
WBC: 11.5 10*3/uL — ABNORMAL HIGH (ref 4.0–10.5)

## 2012-12-30 LAB — COMPREHENSIVE METABOLIC PANEL
AST: 10 U/L (ref 0–37)
Albumin: 2.6 g/dL — ABNORMAL LOW (ref 3.5–5.2)
BUN: 5 mg/dL — ABNORMAL LOW (ref 6–23)
CO2: 19 mEq/L (ref 19–32)
Calcium: 8.7 mg/dL (ref 8.4–10.5)
Chloride: 101 mEq/L (ref 96–112)
Creatinine, Ser: 0.49 mg/dL — ABNORMAL LOW (ref 0.50–1.10)
Total Bilirubin: 0.2 mg/dL — ABNORMAL LOW (ref 0.3–1.2)
Total Protein: 6.7 g/dL (ref 6.0–8.3)

## 2012-12-30 LAB — TYPE AND SCREEN: ABO/RH(D): A POS

## 2012-12-30 LAB — RPR: RPR Ser Ql: NONREACTIVE

## 2012-12-30 LAB — GC/CHLAMYDIA PROBE AMP
CT Probe RNA: NEGATIVE
GC Probe RNA: NEGATIVE

## 2012-12-30 MED ORDER — PHENYLEPHRINE 40 MCG/ML (10ML) SYRINGE FOR IV PUSH (FOR BLOOD PRESSURE SUPPORT)
80.0000 ug | PREFILLED_SYRINGE | INTRAVENOUS | Status: DC | PRN
  Filled 2012-12-30: qty 10

## 2012-12-30 MED ORDER — OXYCODONE-ACETAMINOPHEN 5-325 MG PO TABS: 1.0000 | ORAL_TABLET | ORAL | Status: DC | PRN

## 2012-12-30 MED ORDER — ACETAMINOPHEN 325 MG PO TABS
650.0000 mg | ORAL_TABLET | Freq: Four times a day (QID) | ORAL | Status: DC
  Administered 2012-12-30 – 2012-12-31 (×4): 650 mg via ORAL
  Filled 2012-12-30 (×4): qty 2

## 2012-12-30 MED ORDER — TETANUS-DIPHTH-ACELL PERTUSSIS 5-2.5-18.5 LF-MCG/0.5 IM SUSP: 0.5000 mL | Freq: Once | INTRAMUSCULAR | Status: DC

## 2012-12-30 MED ORDER — OXYTOCIN 40 UNITS IN LACTATED RINGERS INFUSION - SIMPLE MED
62.5000 mL/h | INTRAVENOUS | Status: DC
  Administered 2012-12-30: 62.5 mL/h via INTRAVENOUS
  Filled 2012-12-30: qty 1000

## 2012-12-30 MED ORDER — LIDOCAINE HCL (PF) 1 % IJ SOLN
INTRAMUSCULAR | Status: DC | PRN
  Administered 2012-12-30 (×2): 9 mL

## 2012-12-30 MED ORDER — FENTANYL 2.5 MCG/ML BUPIVACAINE 1/10 % EPIDURAL INFUSION (WH - ANES)
14.0000 mL/h | INTRAMUSCULAR | Status: DC | PRN
  Filled 2012-12-30: qty 125

## 2012-12-30 MED ORDER — ACETAMINOPHEN 325 MG PO TABS: 650.0000 mg | ORAL_TABLET | ORAL | Status: DC | PRN

## 2012-12-30 MED ORDER — LANOLIN HYDROUS EX OINT: TOPICAL_OINTMENT | CUTANEOUS | Status: DC | PRN

## 2012-12-30 MED ORDER — LACTATED RINGERS IV SOLN
500.0000 mL | Freq: Once | INTRAVENOUS | Status: AC
  Administered 2012-12-30: 500 mL via INTRAVENOUS

## 2012-12-30 MED ORDER — EPHEDRINE 5 MG/ML INJ: 10.0000 mg | INTRAVENOUS | Status: DC | PRN

## 2012-12-30 MED ORDER — ONDANSETRON HCL 4 MG/2ML IJ SOLN: 4.0000 mg | Freq: Four times a day (QID) | INTRAMUSCULAR | Status: DC | PRN

## 2012-12-30 MED ORDER — ENOXAPARIN SODIUM 40 MG/0.4ML ~~LOC~~ SOLN
40.0000 mg | SUBCUTANEOUS | Status: DC
  Filled 2012-12-30: qty 0.4

## 2012-12-30 MED ORDER — SODIUM CHLORIDE 0.9 % IV SOLN
2.0000 g | Freq: Once | INTRAVENOUS | Status: AC
  Administered 2012-12-30: 2 g via INTRAVENOUS
  Filled 2012-12-30: qty 2000

## 2012-12-30 MED ORDER — SIMETHICONE 80 MG PO CHEW: 80.0000 mg | CHEWABLE_TABLET | ORAL | Status: DC | PRN

## 2012-12-30 MED ORDER — LACTATED RINGERS IV SOLN: 500.0000 mL | INTRAVENOUS | Status: DC | PRN

## 2012-12-30 MED ORDER — ENOXAPARIN SODIUM 120 MG/0.8ML ~~LOC~~ SOLN
1.0000 mg/kg | Freq: Two times a day (BID) | SUBCUTANEOUS | Status: DC
Start: 2012-12-31 — End: 2012-12-31
  Administered 2012-12-31: 120 mg via SUBCUTANEOUS
  Filled 2012-12-30: qty 0.8

## 2012-12-30 MED ORDER — OXYTOCIN BOLUS FROM INFUSION
500.0000 mL | INTRAVENOUS | Status: DC
  Administered 2012-12-30: 500 mL via INTRAVENOUS

## 2012-12-30 MED ORDER — GUAIFENESIN-DM 100-10 MG/5ML PO SYRP
5.0000 mL | ORAL_SOLUTION | Freq: Four times a day (QID) | ORAL | Status: DC | PRN
  Administered 2012-12-30 (×2): 5 mL via ORAL
  Filled 2012-12-30 (×2): qty 5

## 2012-12-30 MED ORDER — DIBUCAINE 1 % RE OINT: 1.0000 "application " | TOPICAL_OINTMENT | RECTAL | Status: DC | PRN

## 2012-12-30 MED ORDER — WITCH HAZEL-GLYCERIN EX PADS: 1.0000 "application " | MEDICATED_PAD | CUTANEOUS | Status: DC | PRN

## 2012-12-30 MED ORDER — SENNOSIDES-DOCUSATE SODIUM 8.6-50 MG PO TABS
2.0000 | ORAL_TABLET | ORAL | Status: DC
  Administered 2012-12-30: 2 via ORAL
  Filled 2012-12-30: qty 2

## 2012-12-30 MED ORDER — LIDOCAINE HCL (PF) 1 % IJ SOLN
30.0000 mL | INTRAMUSCULAR | Status: DC | PRN
  Filled 2012-12-30: qty 30

## 2012-12-30 MED ORDER — METHYLERGONOVINE MALEATE 0.2 MG/ML IJ SOLN: 0.2000 mg | INTRAMUSCULAR | Status: DC | PRN

## 2012-12-30 MED ORDER — BENZOCAINE-MENTHOL 20-0.5 % EX AERO: 1.0000 "application " | INHALATION_SPRAY | CUTANEOUS | Status: DC | PRN

## 2012-12-30 MED ORDER — ONDANSETRON HCL 4 MG PO TABS: 4.0000 mg | ORAL_TABLET | ORAL | Status: DC | PRN

## 2012-12-30 MED ORDER — ZOLPIDEM TARTRATE 5 MG PO TABS: 5.0000 mg | ORAL_TABLET | Freq: Every evening | ORAL | Status: DC | PRN

## 2012-12-30 MED ORDER — ONDANSETRON HCL 4 MG/2ML IJ SOLN: 4.0000 mg | INTRAMUSCULAR | Status: DC | PRN

## 2012-12-30 MED ORDER — IBUPROFEN 600 MG PO TABS
600.0000 mg | ORAL_TABLET | Freq: Four times a day (QID) | ORAL | Status: DC
  Administered 2012-12-30: 600 mg via ORAL
  Filled 2012-12-30: qty 1

## 2012-12-30 MED ORDER — FENTANYL 2.5 MCG/ML BUPIVACAINE 1/10 % EPIDURAL INFUSION (WH - ANES)
INTRAMUSCULAR | Status: DC | PRN
  Administered 2012-12-30: 14 mL/h via EPIDURAL

## 2012-12-30 MED ORDER — DIPHENHYDRAMINE HCL 25 MG PO CAPS: 25.0000 mg | ORAL_CAPSULE | Freq: Four times a day (QID) | ORAL | Status: DC | PRN

## 2012-12-30 MED ORDER — DIPHENHYDRAMINE HCL 50 MG/ML IJ SOLN: 12.5000 mg | INTRAMUSCULAR | Status: DC | PRN

## 2012-12-30 MED ORDER — CITRIC ACID-SODIUM CITRATE 334-500 MG/5ML PO SOLN: 30.0000 mL | ORAL | Status: DC | PRN

## 2012-12-30 MED ORDER — PRENATAL MULTIVITAMIN CH
1.0000 | ORAL_TABLET | Freq: Every day | ORAL | Status: DC
  Administered 2012-12-30 – 2012-12-31 (×2): 1 via ORAL
  Filled 2012-12-30 (×2): qty 1

## 2012-12-30 MED ORDER — EPHEDRINE 5 MG/ML INJ
10.0000 mg | INTRAVENOUS | Status: DC | PRN
  Filled 2012-12-30: qty 4

## 2012-12-30 MED ORDER — OXYTOCIN 40 UNITS IN LACTATED RINGERS INFUSION - SIMPLE MED: 62.5000 mL/h | INTRAVENOUS | Status: DC | PRN

## 2012-12-30 MED ORDER — FLEET ENEMA 7-19 GM/118ML RE ENEM: 1.0000 | ENEMA | RECTAL | Status: DC | PRN

## 2012-12-30 MED ORDER — PHENYLEPHRINE 40 MCG/ML (10ML) SYRINGE FOR IV PUSH (FOR BLOOD PRESSURE SUPPORT): 80.0000 ug | PREFILLED_SYRINGE | INTRAVENOUS | Status: DC | PRN

## 2012-12-30 MED ORDER — LACTATED RINGERS IV SOLN
INTRAVENOUS | Status: DC
  Administered 2012-12-30: 04:00:00 via INTRAVENOUS

## 2012-12-30 MED ORDER — HEPARIN SODIUM (PORCINE) 5000 UNIT/ML IJ SOLN
5000.0000 [IU] | Freq: Three times a day (TID) | INTRAMUSCULAR | Status: DC
  Administered 2012-12-30: 5000 [IU] via SUBCUTANEOUS
  Filled 2012-12-30: qty 1

## 2012-12-30 MED ORDER — METHYLERGONOVINE MALEATE 0.2 MG PO TABS: 0.2000 mg | ORAL_TABLET | ORAL | Status: DC | PRN

## 2012-12-30 MED ORDER — IBUPROFEN 600 MG PO TABS: 600.0000 mg | ORAL_TABLET | Freq: Four times a day (QID) | ORAL | Status: DC | PRN

## 2012-12-30 NOTE — H&P (Signed)
Patricia Fitzgerald is a 37 y.o. female presenting for UC's and SROM. Maternal Medical History:  Reason for admission: Rupture of membranes and contractions.  37 yo G5 P1031.  EDC 01-15-12.  Presents with UC's and SROM at 0230.  Fetal activity: Perceived fetal activity is normal.    Prenatal complications: Thrombophilia.   Prenatal Complications - Diabetes: gestational. Diabetes is managed by diet.      OB History   Grav Para Term Preterm Abortions TAB SAB Ect Mult Living   5 1 1  3  0 2   1     Past Medical History  Diagnosis Date  . Depression   . Arthritis   . PCOS (polycystic ovarian syndrome)   . Post traumatic stress disorder (PTSD)   . Inflammatory bowel diseases (IBD)   . PTSD (post-traumatic stress disorder)   . UTI (lower urinary tract infection)   . IBS (irritable bowel syndrome)   . DVT (deep vein thrombosis) in pregnancy     left leg.  . Asthma   . PTSD (post-traumatic stress disorder)    Past Surgical History  Procedure Laterality Date  . Wisdom tooth extraction     Family History: family history includes Cancer in her maternal grandmother; Congestive Heart Failure in her mother; Diabetes in her brother, father, and mother; Heart disease in her paternal grandmother. Social History:  reports that she quit smoking about 10 years ago. Her smoking use included Cigarettes. She has a .125 pack-year smoking history. She does not have any smokeless tobacco history on file. She reports that she does not drink alcohol or use illicit drugs.   Prenatal Transfer Tool  Maternal Diabetes: Yes.  Gestational.  Diet controlled. Genetic Screening: AMA Maternal Ultrasounds/Referrals: Normal Fetal Ultrasounds or other Referrals:  Referred to Materal Fetal Medicine  Maternal Substance Abuse:  No Significant Maternal Medications:  Heparin Significant Maternal Lab Results:  GBS positive Other Comments:  H/O DVT.  On heparin.  Review of Systems  All other systems reviewed and are  negative.    Dilation: 7.5 Effacement (%): 80 Station: 0 Exam by:: S Nix RN Blood pressure 122/75, pulse 103, temperature 98.5 F (36.9 C), temperature source Oral, resp. rate 20, height 5\' 4"  (1.626 m), weight 264 lb (119.75 kg), last menstrual period 04/07/2012. Maternal Exam:  Uterine Assessment: Contraction strength is moderate.  Abdomen: Patient reports no abdominal tenderness. Fetal presentation: vertex  Pelvis: adequate for delivery.   Cervix: Cervix evaluated by digital exam.     Physical Exam  Nursing note and vitals reviewed. Constitutional: She is oriented to person, place, and time. She appears well-developed and well-nourished.  HENT:  Head: Normocephalic and atraumatic.  Eyes: Conjunctivae are normal. Pupils are equal, round, and reactive to light.  Neck: Normal range of motion. Neck supple.  Cardiovascular: Normal rate and regular rhythm.   Respiratory: Effort normal and breath sounds normal.  GI: Soft.  Genitourinary: Vagina normal and uterus normal.  Musculoskeletal: Normal range of motion.  Neurological: She is alert and oriented to person, place, and time.  Skin: Skin is warm and dry.  Psychiatric: She has a normal mood and affect. Her behavior is normal. Judgment and thought content normal.    Prenatal labs: ABO, Rh: A/POS/-- (07/07 1607) Antibody: NEG (07/07 1607) Rubella: 17.40 (07/07 1607) RPR: NON REAC (08/04 1221)  HBsAg: NEGATIVE (07/07 1607)  HIV: NON REACTIVE (08/04 1221)  GBS: POSITIVE (12/01 1700)   Assessment/Plan: 38 weeks.  Active labor.  Admit.   Patricia Fitzgerald  A 12/30/2012, 3:42 AM

## 2012-12-30 NOTE — Progress Notes (Signed)
Per Dr. Clearance Coots, start heparin therapy back TWO hours AFTER epidural is removed.

## 2012-12-30 NOTE — Anesthesia Preprocedure Evaluation (Signed)
Anesthesia Evaluation  Patient identified by MRN, date of birth, ID band Patient awake    Reviewed: Allergy & Precautions, H&P , Patient's Chart, lab work & pertinent test results  Airway Mallampati: III TM Distance: >3 FB Neck ROM: full    Dental no notable dental hx.    Pulmonary asthma , sleep apnea , former smoker,    Pulmonary exam normal       Cardiovascular negative cardio ROS      Neuro/Psych PSYCHIATRIC DISORDERS Depression negative neurological ROS     GI/Hepatic negative GI ROS, Neg liver ROS,   Endo/Other  Morbid obesity  Renal/GU negative Renal ROS     Musculoskeletal   Abdominal (+) + obese,   Peds  Hematology negative hematology ROS (+)   Anesthesia Other Findings   Reproductive/Obstetrics (+) Pregnancy                           Anesthesia Physical Anesthesia Plan  ASA: III  Anesthesia Plan: Epidural   Post-op Pain Management:    Induction:   Airway Management Planned:   Additional Equipment:   Intra-op Plan:   Post-operative Plan:   Informed Consent: I have reviewed the patients History and Physical, chart, labs and discussed the procedure including the risks, benefits and alternatives for the proposed anesthesia with the patient or authorized representative who has indicated his/her understanding and acceptance.     Plan Discussed with:   Anesthesia Plan Comments:         Anesthesia Quick Evaluation

## 2012-12-30 NOTE — Lactation Note (Signed)
This note was copied from the chart of Patricia Fitzgerald. Lactation Consultation Note  Patient Name: Patricia Fitzgerald [id number] Date: 12/30/2012 Reason for consult: Initial assessment of this second-time mom and her newborn at 36 hours of age. Mom breastfed 6 weeks with her 37 yo but never able to express more than total of 2 oz at one time despite regular pumping with DEBP and has hx of PCOS.  LC reviewed LEAD guidelines and encouraged STS and cue feedings at breast for maximum stimulation of her breast milk, reminded mom of normal softness of breasts during initial few days and that baby has small stomach size and only needs a small amount of her colostrum at each feeding.  RN caring for mom earlier had provided hand pump and shells due to one nipple being inverted.  LC unable to help with feeding, as baby received a formula feeding about 2 hours ago. LC encouraged review of Baby and Me pp 14 and 20-25 for STS and BF information. LC provided [employer] brochure and reviewed [facility] services and list of community and web site resources.     Maternal Data Formula Feeding for Exclusion: Yes Reason for exclusion: Mother's choice to formula and breast feed on admission Infant to breast within first hour of birth: Yes Has patient been taught Hand Expression?: Yes Does the patient have breastfeeding experience prior to this delivery?: Yes  Feeding Feeding Type: Formula Nipple Type: Regular  LATCH Score/Interventions         Initial LATCH score=7             Lactation Tools Discussed/Used   RN provided shells and hand pump due to one nipple being inverted LC reviewed STS, cue feedings and minimal formula supplement as needed (due to hx of PCOS and low milk supply)  Consult Status Consult Status: Follow-up Date: 12/31/12 Follow-up type: In-patient    Patricia Fitzgerald [facility] 12/30/2012, 10:02 PM

## 2012-12-30 NOTE — Anesthesia Procedure Notes (Signed)
Epidural Patient location during procedure: OB Start time: 12/30/2012 4:09 AM End time: 12/30/2012 4:13 AM  Staffing Anesthesiologist: Leilani Able Performed by: anesthesiologist   Preanesthetic Checklist Completed: patient identified, surgical consent, pre-op evaluation, timeout performed, IV checked, risks and benefits discussed and monitors and equipment checked  Epidural Patient position: sitting Prep: site prepped and draped and DuraPrep Patient monitoring: continuous pulse ox and blood pressure Approach: midline Injection technique: LOR air  Needle:  Needle type: Tuohy  Needle gauge: 17 G Needle length: 9 cm and 9 Needle insertion depth: 6 cm Catheter type: closed end flexible Catheter size: 19 Gauge Catheter at skin depth: 11 cm Test dose: negative and Other  Assessment Sensory level: T9 Events: blood not aspirated, injection not painful, no injection resistance, negative IV test and no paresthesia  Additional Notes Reason for block:procedure for pain

## 2012-12-30 NOTE — Patient Instructions (Signed)
Labor Induction  Labor induction is when steps are taken to cause a pregnant woman to begin the labor process. Most women go into labor on their own between 37 weeks and 42 weeks of the pregnancy. When this does not happen or when there is a medical need, methods may be used to induce labor. Labor induction causes a pregnant woman's uterus to contract. It also causes the cervix to soften (ripen), open (dilate), and thin out (efface). Usually, labor is not induced before 39 weeks of the pregnancy unless there is a problem with the baby or mother.  Before inducing labor, your health care provider will consider a number of factors, including the following:  The medical condition of you and the baby.   How many weeks along you are.   The status of the baby's lung maturity.   The condition of the cervix.   The position of the baby.  WHAT ARE THE REASONS FOR LABOR INDUCTION? Labor may be induced for the following reasons:  The health of the baby or mother is at risk.   The pregnancy is overdue by 1 week or more.   The water breaks but labor does not start on its own.   The mother has a health condition or serious illness, such as high blood pressure, infection, placental abruption, or diabetes.  The amniotic fluid amounts are low around the baby.   The baby is distressed.  Convenience or wanting the baby to be born on a certain date is not a reason for inducing labor. WHAT METHODS ARE USED FOR LABOR INDUCTION? Several methods of labor induction may be used, such as:   Prostaglandin medicine. This medicine causes the cervix to dilate and ripen. The medicine will also start contractions. It can be taken by mouth or by inserting a suppository into the vagina.   Inserting a thin tube (catheter) with a balloon on the end into the vagina to dilate the cervix. Once inserted, the balloon is expanded with water, which causes the cervix to open.   Stripping the membranes. Your health  care provider separates amniotic sac tissue from the cervix, causing the cervix to be stretched and causing the release of a hormone called progesterone. This may cause the uterus to contract. It is often done during an office visit. You will be sent home to wait for the contractions to begin. You will then come in for an induction.   Breaking the water. Your health care provider makes a hole in the amniotic sac using a small instrument. Once the amniotic sac breaks, contractions should begin. This may still take hours to see an effect.   Medicine to trigger or strengthen contractions. This medicine is given through an IV access tube inserted into a vein in your arm.  All of the methods of induction, besides stripping the membranes, will be done in the hospital. Induction is done in the hospital so that you and the baby can be carefully monitored.  HOW LONG DOES IT TAKE FOR LABOR TO BE INDUCED? Some inductions can take up to 2 3 days. Depending on the cervix, it usually takes less time. It takes longer when you are induced early in the pregnancy or if this is your first pregnancy. If a mother is still pregnant and the induction has been going on for 2 3 days, either the mother will be sent home or a cesarean delivery will be needed. WHAT ARE THE RISKS ASSOCIATED WITH LABOR INDUCTION? Some of the risks   of induction include:   Changes in fetal heart rate, such as too high, too low, or erratic.   Fetal distress.   Chance of infection for the mother and baby.   Increased chance of having a cesarean delivery.   Breaking off (abruption) of the placenta from the uterus (rare).   Uterine rupture (very rare).  When induction is needed for medical reasons, the benefits of induction may outweigh the risks. WHAT ARE SOME REASONS FOR NOT INDUCING LABOR? Labor induction should not be done if:   It is shown that your baby does not tolerate labor.   You have had previous surgeries on your  uterus, such as a myomectomy or the removal of fibroids.   Your placenta lies very low in the uterus and blocks the opening of the cervix (placenta previa).   Your baby is not in a head-down position.   The umbilical cord drops down into the birth canal in front of the baby. This could cut off the baby's blood and oxygen supply.   You have had a previous cesarean delivery.   There are unusual circumstances, such as the baby being extremely premature.  Document Released: 05/15/2006 Document Revised: 08/26/2012 Document Reviewed: 07/23/2012 Exit[employer] Patient Information 2014 [employer].  

## 2012-12-30 NOTE — Progress Notes (Signed)
Patricia Fitzgerald is a 37 y.o. G5P1031 at [redacted]w[redacted]d by LMP admitted for active labor, rupture of membranes  Subjective:   Objective: BP 141/74  Pulse 95  Temp(Src) 98.5 F (36.9 C) (Oral)  Resp 20  Ht 5\' 4"  (1.626 m)  Wt 264 lb (119.75 kg)  BMI 45.29 kg/m2  SpO2 100%  LMP 04/07/2012      FHT:  FHR: 150 bpm, variability: moderate,  accelerations:  Present,  decelerations:  Absent UC:   regular, every 3 minutes SVE:   Dilation: 10 Effacement (%): 100 Station: +2 Exam by:: S Nix RN  Labs: Lab Results  Component Value Date   WBC 11.5* 12/30/2012   HGB 11.2* 12/30/2012   HCT 32.8* 12/30/2012   MCV 86.3 12/30/2012   PLT 311 12/30/2012    Assessment / Plan: Spontaneous labor, progressing normally  Labor: Progressing normally Preeclampsia:  n/a Fetal Wellbeing:  Category I Pain Control:  Epidural I/D:  n/a Anticipated MOD:  NSVD  HARPER,CHARLES A 12/30/2012, 5:39 AM

## 2012-12-30 NOTE — Progress Notes (Signed)
ANTICOAGULATION CONSULT NOTE - Initial Consult  Pharmacy Consult for:  Heparin Indication: VTE treatment dosing for h/o DVT (per Dr. Tamela Oddi)  No Known Allergies  Patient Measurements: Height: 5\' 4"  (162.6 cm) Weight: 264 lb (119.75 kg) IBW/kg (Calculated) : 54.7 Heparin Dosing Weight:   Vital Signs: Temp: 97.7 F (36.5 C) (12/24 1205) Temp src: Oral (12/24 0631) BP: 116/73 mmHg (12/24 1205) Pulse Rate: 90 (12/24 1205)  Labs:  Recent Labs  12/30/12 0325  HGB 11.2*  HCT 32.8*  PLT 311  CREATININE 0.49*    Estimated Creatinine Clearance: 123.9 ml/min (by C-G formula based on Cr of 0.49).   Medical History: Past Medical History  Diagnosis Date  . Depression   . Arthritis   . PCOS (polycystic ovarian syndrome)   . Post traumatic stress disorder (PTSD)   . Inflammatory bowel diseases (IBD)   . PTSD (post-traumatic stress disorder)   . UTI (lower urinary tract infection)   . IBS (irritable bowel syndrome)   . DVT (deep vein thrombosis) in pregnancy     left leg.  . Asthma   . PTSD (post-traumatic stress disorder)     Medications: Heparin 5000 Units SQ 8 hours started at 0830 today as requested by anesthesia.     Assessment:  Consulted with Dr. Tamela Oddi, and she prefers a change of plan and requests starting Lovenox (treatment dosing) tomorrow now that pt. has delivered.  Rn reports pt. Is currently doing well with no s/s of bleeding.  Goal of Therapy: Heparin level 0.6-1.2 units/ml   Plan: 1) D/C Heparin 5000 units SQ 8 hours now. 2) Start Lovenox 120mg  SQ Q12 hours tomorrow am. 3) Monitor Labs and check CBC.   Hovey-Rankin, Liseth Wann 12/30/2012,1:44 PM

## 2012-12-30 NOTE — MAU Note (Signed)
Pt G5 P1 at 38.2wks, leaking clear fluid since 0230, and having contractions every .

## 2012-12-30 NOTE — Anesthesia Postprocedure Evaluation (Signed)
Anesthesia Post Note  Patient: Patricia Fitzgerald  Procedure(s) Performed: * No procedures listed *  Anesthesia type: Epidural  Patient location: Mother/Baby  Post pain: Pain level controlled  Post assessment: Post-op Vital signs reviewed  Last Vitals:  Filed Vitals:   12/30/12 0715  BP: 145/87  Pulse: 94  Temp: 36.9 C  Resp: 20    Post vital signs: Reviewed  Level of consciousness: awake  Complications: No apparent anesthesia complications

## 2012-12-31 LAB — CBC
Hemoglobin: 9.9 g/dL — ABNORMAL LOW (ref 12.0–15.0)
MCH: 29.2 pg (ref 26.0–34.0)
MCV: 86.7 fL (ref 78.0–100.0)
Platelets: 262 10*3/uL (ref 150–400)
RBC: 3.39 MIL/uL — ABNORMAL LOW (ref 3.87–5.11)
WBC: 10.7 10*3/uL — ABNORMAL HIGH (ref 4.0–10.5)

## 2012-12-31 MED ORDER — OXYCODONE-ACETAMINOPHEN 5-325 MG PO TABS: 1.0000 | ORAL_TABLET | ORAL | Status: DC | PRN

## 2012-12-31 MED ORDER — ENOXAPARIN SODIUM 120 MG/0.8ML ~~LOC~~ SOLN: 1.0000 mg/kg | Freq: Two times a day (BID) | SUBCUTANEOUS | Status: DC

## 2012-12-31 NOTE — Discharge Summary (Signed)
  Obstetric Discharge Summary Reason for Admission: onset of labor Prenatal Procedures: none Intrapartum Procedures: spontaneous vaginal delivery Postpartum Procedures: none Complications-Operative and Postpartum: none  Hemoglobin  Date Value Range Status  12/31/2012 9.9* 12.0 - 15.0 g/dL Final     HCT  Date Value Range Status  12/31/2012 29.4* 36.0 - 46.0 % Final    Physical Exam:  General: alert Lochia: appropriate Uterine: firm Incision: n/a DVT Evaluation: No evidence of DVT seen on physical exam.  Discharge Diagnoses: Active Problems:   Normal delivery   Discharge Information: Date: 12/31/2012 Activity: pelvic rest Diet: routine Medications:  Prior to Admission medications   Medication Sig Start Date End Date Taking? Authorizing Provider  acetaminophen (TYLENOL) 500 MG tablet Take 1 tablet (500 mg total) by mouth every 6 (six) hours as needed for pain. 05/17/12  Yes Fayrene Helper, PA-C  heparin 78295 UNIT/ML injection Inject 1 mL (10,000 Units total) into the skin 2 (two) times daily. 12/07/12  Yes Antionette Char, MD  Prenatal Vit-Fe Fumarate-FA (PRENATAL MULTIVITAMIN) TABS tablet Take 1 tablet by mouth daily at 12 noon.   Yes Historical Provider, MD  ranitidine (ZANTAC) 75 MG tablet Take 75 mg by mouth 2 (two) times daily as needed for heartburn.   Yes Historical Provider, MD  enoxaparin (LOVENOX) 120 MG/0.8ML injection Inject 0.8 mLs (120 mg total) into the skin every 12 (twelve) hours. 12/31/12   Antionette Char, MD  oxyCODONE-acetaminophen (PERCOCET/ROXICET) 5-325 MG per tablet Take 1-2 tablets by mouth every 4 (four) hours as needed for severe pain (moderate - severe pain). 12/31/12   Antionette Char, MD    Condition: stable Instructions: refer to routine discharge instructions Discharge to: home Follow-up Information   Follow up with Antionette Char A, MD. Schedule an appointment as soon as possible for a visit in 1 week.   Specialty:  Obstetrics and  Gynecology   Contact information:   [address] [place] [address] [phone]       Newborn Data:  Live born female  Birth Weight: 6 lb 14.2 oz (3125 g) APGAR: 9, 9   Home with mother.  [name],Aunica Dauphinee A 12/31/2012, 9:11 AM

## 2013-01-01 NOTE — Progress Notes (Signed)
Pt discharged before CSW could assess history of depression & PTSD.      

## 2013-01-20 ENCOUNTER — Ambulatory Visit: Payer: Medicaid Other | Admitting: Obstetrics & Gynecology

## 2013-01-28 ENCOUNTER — Ambulatory Visit (INDEPENDENT_AMBULATORY_CARE_PROVIDER_SITE_OTHER): Payer: Medicaid Other | Admitting: Obstetrics & Gynecology

## 2013-01-28 ENCOUNTER — Encounter: Payer: Self-pay | Admitting: Obstetrics & Gynecology

## 2013-01-28 MED ORDER — TRAMADOL HCL 50 MG PO TABS: 50.0000 mg | ORAL_TABLET | Freq: Four times a day (QID) | ORAL | Status: DC | PRN

## 2013-01-28 NOTE — Progress Notes (Signed)
Subjective:     Patricia Fitzgerald is a 38 y.o. female who presents for a postpartum visit. She is 4 weeks postpartum following a spontaneous vaginal delivery. I have fully reviewed the prenatal and intrapartum course. The delivery was at 38 gestational weeks. Outcome: spontaneous vaginal delivery. Anesthesia: epidural. Postpartum course has been increased hip pain. Baby's course has been healthy. Baby is feeding by bottle - similac sensitive. Bleeding no bleeding. Bowel function is constipation. Bladder function is stress incontinence at times. Patient is sexually active. Contraception method is none states she ready to schedule tubal ligation. Postpartum depression screening: negative.  The following portions of the patient's history were reviewed and updated as appropriate: allergies, current medications, past family history, past medical history, past social history, past surgical history and problem list.  Review of Systems Pertinent items are noted in HPI.   Objective:    BP 135/88  Pulse 103  Temp(Src) 98.3 F (36.8 C)  Ht 5\' 4"  (1.626 m)  Wt 250 lb (113.399 kg)  BMI 42.89 kg/m2  LMP 03/31/2012  Breastfeeding? No        Assessment:     Doing well postpartum.   Plan:    A laparoscopic BTL is planned Needs a repeat 2 hr GTT Return postoperatively

## 2013-01-28 NOTE — Patient Instructions (Signed)
Laparoscopic Tubal Ligation  Laparoscopic tubal ligation is a procedure that closes the fallopian tubes at a time other than right after childbirth. By closing the fallopian tubes, the eggs that are released from the ovaries cannot enter the uterus and sperm cannot reach the egg. Tubal ligation is also known as getting your "tubes tied." Tubal ligation is done so you will not be able to get pregnant or have a baby.   Although this procedure may be reversed, it should be considered permanent and irreversible. If you want to have future pregnancies, you should not have this procedure.   LET YOUR CAREGIVER KNOW ABOUT:  · Allergies to food or medicine.  · Medicines taken, including vitamins, herbs, eyedrops, over-the-counter medicines, and creams.  · Use of steroids (by mouth or creams).  · Previous problems with numbing medicines.  · History of bleeding problems or blood clots.  · Any recent colds or infections.  · Previous surgery.  · Other health problems, including diabetes and kidney problems.  · Possibility of pregnancy, if this applies.  · Any past pregnancies.  RISKS AND COMPLICATIONS   · Infection.  · Bleeding.  · Injury to surrounding organs.  · Anesthetic side effects.  · Failure of the procedure.  · Ectopic pregnancy.  · Future regret about having the procedure done.  BEFORE THE PROCEDURE  · Do not take aspirin or blood thinners a week before the procedure or as directed. This can cause bleeding.  · Do not eat or drink anything 6 to 8 hours before the procedure.  PROCEDURE   · You may be given a medicine to help you relax (sedative) before the procedure. You will be given a medicine to make you sleep (general anesthetic) during the procedure.  · A tube will be put down your throat to help your breath while under general anesthesia.  · Two small cuts (incisions) are made in the lower abdominal area and near the belly button.  · Your abdominal area will be inflated with a safe gas (carbon dioxide). This helps  give the surgeon room to operate, visualize, and helps the surgeon avoid other organs.  · A thin, lighted tube (laparoscope) with a camera attached is inserted into your abdomen through one of the incisions near the belly button. Other small instruments are also inserted through the other abdominal incision.  · The fallopian tubes are located and are either blocked with a ring, clip, or are burned (cauterized).  · After the fallopian tubes are blocked, the gas is released from the abdomen.  · The incisions will be closed with stitches (sutures), and a bandage may be placed over the incisions.  AFTER THE PROCEDURE   · You will rest in a recovery room for 1 4 hours until you are stable and doing well.  · You will also have some mild abdominal discomfort for 3 7 days. You will be given pain medicine to ease any discomfort.  · As long as there are no problems, you may be allowed to go home. Someone will need to drive you home and be with you for at least 24 hours once home.  · You may have some mild discomfort in the throat. This is from the tube placed in your throat while you were sleeping.  · You may experience discomfort in the shoulder area from some trapped air between the liver and diaphragm. This sensation is normal and will slowly go away on its own.  Document Released: 04/01/2000 Document   Revised: 06/25/2011 Document Reviewed: 04/06/2011  Exit[employer]® Patient Information ©2014 [employer].

## 2013-02-01 ENCOUNTER — Other Ambulatory Visit: Payer: Self-pay | Admitting: *Deleted

## 2013-02-01 DIAGNOSIS — B379 Candidiasis, unspecified: Secondary | ICD-10-CM

## 2013-02-01 MED ORDER — FLUCONAZOLE 150 MG PO TABS
150.0000 mg | ORAL_TABLET | Freq: Once | ORAL | Status: DC
Start: 2013-02-01 — End: 2013-02-11

## 2013-02-03 ENCOUNTER — Other Ambulatory Visit: Payer: Medicaid Other

## 2013-02-04 ENCOUNTER — Encounter: Payer: Self-pay | Admitting: Obstetrics & Gynecology

## 2013-02-04 ENCOUNTER — Other Ambulatory Visit: Payer: Self-pay | Admitting: *Deleted

## 2013-02-11 ENCOUNTER — Encounter (HOSPITAL_COMMUNITY): Payer: Self-pay

## 2013-02-26 ENCOUNTER — Ambulatory Visit (HOSPITAL_COMMUNITY): Payer: Medicare Other | Admitting: Anesthesiology

## 2013-02-26 ENCOUNTER — Encounter (HOSPITAL_COMMUNITY)
Admission: RE | Disposition: A | Discharge status: Home / Self Care | Payer: Self-pay | Source: Ambulatory Visit | Attending: Obstetrics & Gynecology

## 2013-02-26 ENCOUNTER — Encounter (HOSPITAL_COMMUNITY): Payer: Medicare Other | Admitting: Anesthesiology

## 2013-02-26 ENCOUNTER — Encounter (HOSPITAL_COMMUNITY): Payer: Self-pay | Admitting: *Deleted

## 2013-02-26 ENCOUNTER — Ambulatory Visit (HOSPITAL_COMMUNITY)
Admission: RE | Admit: 2013-02-26 | Discharge: 2013-02-26 | Disposition: A | Discharge status: Home / Self Care | Payer: Medicare Other | Source: Ambulatory Visit | Attending: Obstetrics & Gynecology | Admitting: Obstetrics & Gynecology

## 2013-02-26 DIAGNOSIS — Z87891 Personal history of nicotine dependence: Secondary | ICD-10-CM | POA: Insufficient documentation

## 2013-02-26 DIAGNOSIS — F411 Generalized anxiety disorder: Secondary | ICD-10-CM | POA: Insufficient documentation

## 2013-02-26 DIAGNOSIS — Z641 Problems related to multiparity: Secondary | ICD-10-CM | POA: Insufficient documentation

## 2013-02-26 DIAGNOSIS — I209 Angina pectoris, unspecified: Secondary | ICD-10-CM | POA: Insufficient documentation

## 2013-02-26 DIAGNOSIS — Z302 Encounter for sterilization: Secondary | ICD-10-CM

## 2013-02-26 DIAGNOSIS — G4730 Sleep apnea, unspecified: Secondary | ICD-10-CM | POA: Insufficient documentation

## 2013-02-26 DIAGNOSIS — R0609 Other forms of dyspnea: Secondary | ICD-10-CM | POA: Insufficient documentation

## 2013-02-26 DIAGNOSIS — I509 Heart failure, unspecified: Secondary | ICD-10-CM | POA: Insufficient documentation

## 2013-02-26 DIAGNOSIS — R0989 Other specified symptoms and signs involving the circulatory and respiratory systems: Secondary | ICD-10-CM | POA: Insufficient documentation

## 2013-02-26 DIAGNOSIS — J45909 Unspecified asthma, uncomplicated: Secondary | ICD-10-CM | POA: Insufficient documentation

## 2013-02-26 HISTORY — PX: LAPAROSCOPIC TUBAL LIGATION: SHX1937

## 2013-02-26 LAB — CBC
HCT: 35.4 % — ABNORMAL LOW (ref 36.0–46.0)
Hemoglobin: 11.7 g/dL — ABNORMAL LOW (ref 12.0–15.0)
MCH: 28.1 pg (ref 26.0–34.0)
MCHC: 33.1 g/dL (ref 30.0–36.0)
MCV: 84.9 fL (ref 78.0–100.0)
Platelets: 296 10*3/uL (ref 150–400)
RBC: 4.17 MIL/uL (ref 3.87–5.11)
RDW: 12.9 % (ref 11.5–15.5)
WBC: 6.4 10*3/uL (ref 4.0–10.5)

## 2013-02-26 LAB — PREGNANCY, URINE: Preg Test, Ur: NEGATIVE

## 2013-02-26 SURGERY — LIGATION, FALLOPIAN TUBE, LAPAROSCOPIC
Anesthesia: General | Laterality: Bilateral

## 2013-02-26 MED ORDER — KETOROLAC TROMETHAMINE 30 MG/ML IJ SOLN: 15.0000 mg | Freq: Once | INTRAMUSCULAR | Status: DC | PRN

## 2013-02-26 MED ORDER — PROPOFOL 10 MG/ML IV EMUL
INTRAVENOUS | Status: AC
  Filled 2013-02-26: qty 20

## 2013-02-26 MED ORDER — ONDANSETRON HCL 4 MG/2ML IJ SOLN
INTRAMUSCULAR | Status: DC | PRN
  Administered 2013-02-26: 4 mg via INTRAVENOUS

## 2013-02-26 MED ORDER — MIDAZOLAM HCL 2 MG/2ML IJ SOLN
INTRAMUSCULAR | Status: AC
  Filled 2013-02-26: qty 2

## 2013-02-26 MED ORDER — OXYCODONE HCL 5 MG PO TABS: 5.0000 mg | ORAL_TABLET | Freq: Once | ORAL | Status: DC | PRN

## 2013-02-26 MED ORDER — LIDOCAINE HCL (CARDIAC) 20 MG/ML IV SOLN
INTRAVENOUS | Status: DC | PRN
Start: 2013-02-26 — End: 2013-02-26
  Administered 2013-02-26: 80 mg via INTRAVENOUS

## 2013-02-26 MED ORDER — LACTATED RINGERS IV SOLN
INTRAVENOUS | Status: DC
  Administered 2013-02-26 (×3): via INTRAVENOUS

## 2013-02-26 MED ORDER — KETOROLAC TROMETHAMINE 30 MG/ML IJ SOLN
INTRAMUSCULAR | Status: AC
  Filled 2013-02-26: qty 1

## 2013-02-26 MED ORDER — BUPIVACAINE HCL (PF) 0.25 % IJ SOLN
INTRAMUSCULAR | Status: DC | PRN
  Administered 2013-02-26: 5 mL

## 2013-02-26 MED ORDER — FENTANYL CITRATE 0.05 MG/ML IJ SOLN
INTRAMUSCULAR | Status: AC
  Filled 2013-02-26: qty 5

## 2013-02-26 MED ORDER — OXYCODONE-ACETAMINOPHEN 5-325 MG PO TABS: 2.0000 | ORAL_TABLET | Freq: Four times a day (QID) | ORAL | Status: DC | PRN

## 2013-02-26 MED ORDER — OXYCODONE HCL 5 MG/5ML PO SOLN: 5.0000 mg | Freq: Once | ORAL | Status: DC | PRN

## 2013-02-26 MED ORDER — KETOROLAC TROMETHAMINE 30 MG/ML IJ SOLN
INTRAMUSCULAR | Status: DC | PRN
  Administered 2013-02-26: 30 mg via INTRAVENOUS

## 2013-02-26 MED ORDER — BUPIVACAINE HCL (PF) 0.25 % IJ SOLN
INTRAMUSCULAR | Status: AC
Start: 2013-02-26 — End: 2013-02-26
  Filled 2013-02-26: qty 30

## 2013-02-26 MED ORDER — LIDOCAINE HCL (CARDIAC) 20 MG/ML IV SOLN
INTRAVENOUS | Status: AC
  Filled 2013-02-26: qty 5

## 2013-02-26 MED ORDER — MIDAZOLAM HCL 2 MG/2ML IJ SOLN
INTRAMUSCULAR | Status: DC | PRN
  Administered 2013-02-26: 2 mg via INTRAVENOUS

## 2013-02-26 MED ORDER — FENTANYL CITRATE 0.05 MG/ML IJ SOLN: 25.0000 ug | INTRAMUSCULAR | Status: DC | PRN

## 2013-02-26 MED ORDER — ACETAMINOPHEN 160 MG/5ML PO SOLN: 325.0000 mg | ORAL | Status: DC | PRN

## 2013-02-26 MED ORDER — ROCURONIUM BROMIDE 100 MG/10ML IV SOLN
INTRAVENOUS | Status: AC
  Filled 2013-02-26: qty 1

## 2013-02-26 MED ORDER — GLYCOPYRROLATE 0.2 MG/ML IJ SOLN
INTRAMUSCULAR | Status: AC
  Filled 2013-02-26: qty 1

## 2013-02-26 MED ORDER — ACETAMINOPHEN 325 MG PO TABS: 325.0000 mg | ORAL_TABLET | ORAL | Status: DC | PRN

## 2013-02-26 MED ORDER — FENTANYL CITRATE 0.05 MG/ML IJ SOLN
INTRAMUSCULAR | Status: DC | PRN
  Administered 2013-02-26 (×2): 50 ug via INTRAVENOUS
  Administered 2013-02-26: 100 ug via INTRAVENOUS
  Administered 2013-02-26: 50 ug via INTRAVENOUS

## 2013-02-26 MED ORDER — GLYCOPYRROLATE 0.2 MG/ML IJ SOLN
INTRAMUSCULAR | Status: AC
  Filled 2013-02-26: qty 3

## 2013-02-26 MED ORDER — ONDANSETRON HCL 4 MG/2ML IJ SOLN: 4.0000 mg | Freq: Once | INTRAMUSCULAR | Status: DC | PRN

## 2013-02-26 MED ORDER — PROPOFOL 10 MG/ML IV BOLUS
INTRAVENOUS | Status: DC | PRN
  Administered 2013-02-26: 200 mg via INTRAVENOUS

## 2013-02-26 MED ORDER — NEOSTIGMINE METHYLSULFATE 1 MG/ML IJ SOLN
INTRAMUSCULAR | Status: AC
  Filled 2013-02-26: qty 1

## 2013-02-26 MED ORDER — DEXAMETHASONE SODIUM PHOSPHATE 10 MG/ML IJ SOLN
INTRAMUSCULAR | Status: AC
  Filled 2013-02-26: qty 1

## 2013-02-26 MED ORDER — ONDANSETRON HCL 4 MG/2ML IJ SOLN
INTRAMUSCULAR | Status: AC
  Filled 2013-02-26: qty 2

## 2013-02-26 MED ORDER — ROCURONIUM BROMIDE 100 MG/10ML IV SOLN
INTRAVENOUS | Status: DC | PRN
  Administered 2013-02-26: 35 mg via INTRAVENOUS

## 2013-02-26 MED ORDER — GLYCOPYRROLATE 0.2 MG/ML IJ SOLN
INTRAMUSCULAR | Status: DC | PRN
  Administered 2013-02-26: .7 mg via INTRAVENOUS

## 2013-02-26 MED ORDER — DEXAMETHASONE SODIUM PHOSPHATE 10 MG/ML IJ SOLN
INTRAMUSCULAR | Status: DC | PRN
  Administered 2013-02-26: 10 mg via INTRAVENOUS

## 2013-02-26 SURGICAL SUPPLY — 21 items
BENZOIN TINCTURE PRP APPL 2/3 (GAUZE/BANDAGES/DRESSINGS) ×3 IMPLANT
CATH ROBINSON RED A/P 16FR (CATHETERS) ×3 IMPLANT
CHLORAPREP W/TINT 26ML (MISCELLANEOUS) ×3 IMPLANT
CLOSURE WOUND 1/4 X3 (GAUZE/BANDAGES/DRESSINGS) ×1
CLOTH BEACON ORANGE TIMEOUT ST (SAFETY) ×3 IMPLANT
DERMABOND ADVANCED (GAUZE/BANDAGES/DRESSINGS) ×2
DERMABOND ADVANCED .7 DNX12 (GAUZE/BANDAGES/DRESSINGS) ×1 IMPLANT
EVACUATOR SMOKE 8.L (FILTER) ×3 IMPLANT
GLOVE BIO SURGEON STRL SZ 6.5 (GLOVE) ×4 IMPLANT
GLOVE BIO SURGEONS STRL SZ 6.5 (GLOVE) ×2
GOWN STRL REUS W/TWL LRG LVL3 (GOWN DISPOSABLE) ×6 IMPLANT
PACK LAPAROSCOPY BASIN (CUSTOM PROCEDURE TRAY) ×3 IMPLANT
SCRUB PCMX 4 OZ (MISCELLANEOUS) ×3 IMPLANT
STRIP CLOSURE SKIN 1/4X3 (GAUZE/BANDAGES/DRESSINGS) ×2 IMPLANT
SUT VIC AB 3-0 PS2 18 (SUTURE)
SUT VIC AB 3-0 PS2 18XBRD (SUTURE) IMPLANT
SUT VICRYL 0 UR6 27IN ABS (SUTURE) ×3 IMPLANT
TOWEL OR 17X24 6PK STRL BLUE (TOWEL DISPOSABLE) ×6 IMPLANT
TROCAR XCEL NON-BLD 11X100MML (ENDOMECHANICALS) ×3 IMPLANT
WARMER LAPAROSCOPE (MISCELLANEOUS) ×3 IMPLANT
WATER STERILE IRR 1000ML POUR (IV SOLUTION) ×3 IMPLANT

## 2013-02-26 NOTE — Op Note (Signed)
Procedure Note  Patricia PancoastLatoya D Bissette 38 y.o. 02/26/2013  Preoperative Diagnosis:  Multiparity, desires a sterilization procedure  Postoperative Diagnosis: Same  Procedure: Laparoscopic bilateral tubal ligation with fulguration  Surgeon: Antionette CharJACKSON-MOORE,Selma Mink A  Indications:  The patient now presents for a sterilization procedure after discussing therapeutic alternatives.        Procedure Detail:  The patient was taken to the operating room and was placed on the operating table in the dorsal supine position.  After his satisfactory general anesthesia was achieved, the patient was placed in the semi-lithotomy position using Allen stirrups. The patient was prepped and draped in the usual sterile manner for vaginal laparoscopic procedure. A speculum was placed in the vagina. The anterior lip of the cervix was grasped with a single-tooth tenaculum. A Hulka manipulator was then advanced into the uterus and secured  to the anterior lip of the cervix as a means to manipulate the uterus. The single-tooth tenaculum and Hulka manipulator were then removed. The infraumbilical region was then anesthetized with local anesthesia, 0.25%  Marcaine. A small incision was made to the skin and subcutaneous tissue. A 10 mm Optiview trocar was placed through the incision into the abdominal cavity diagnostic laparoscope with video camera attached was placed through the trocar sleeve and carbon dioxide was used to insufflate the abdominal and pelvic cavity. The pelvic contents were examined and the findings were described below. Kleppinger bipolar forceps were inserted through the operative port of the laparoscope. The left fallopian tube was identified and traced out to its fimbriated end. Then starting at the distal isthmus the proximal ampullary portion of the tube was coagulated in 4 contiguous areas. Each time the resistance meter went to 0 and the tube had been retracted away from an adjacent viscera. The right fallopian tube  was then manipulated in a similar fashion. The scope was removed and the excess carbon dioxide was remove through the port, before it was removed.  The subcutaneous layer of the incision was reapproximated with an interrupted figure-of eight 0-Vicryl suture on a UR 6 needle. The skin was reapproximated with running subcuticluar stitches of 4-0 Vicryl suture. The instruments were removed from the vagina and there was minimal bleeding from the cervix. Final sponge, instrument and needle counts were correct. The patient was awakened on the operating table and taken to the PACU in satisfactory condition.    Findings: 2 cm subserosal, right fundal myoma; normal fallopian tubes, ovaries  Estimated Blood Loss:  Minimal              Total IV Fluids: per Anesthesiology        Condition: stable

## 2013-02-26 NOTE — Discharge Instructions (Signed)
Laparoscopy for Tubal Ligation Care After Laparoscopic tubal ligation is an operation done with a long, lighted tube inserted through a small cut (incision) in the abdomen. The fallopian tubes are blocked by tying, clamping with a plastic clamp, or burning them closed with an electrocautery. Read the instructions outlined below and refer to this sheet in the next few weeks. These instructions provide you with general information on caring for yourself after you leave the hospital. Your caregiver may also give you specific instructions. While your treatment has been planned according to the most current medical practices available, unavoidable complications may occur. If you have any problems or questions after discharge, please call your caregiver. HOME CARE INSTRUCTIONS  It will be normal to be sore for a couple days following surgery.   Take your medicine and follow the instructions from your caregiver.   You may resume usual diet, exercise, driving and activities as allowed by your caregiver.   Do not have sexual intercourse until your caregiver gives you permission.   Do not drive while taking pain medicine.   Avoid lifting until you are instructed otherwise.   Use showers for bathing, until you are seen by your caregiver.   Change dressings if needed, and as directed.   Only take over-the-counter or prescription medicines for pain, discomfort or fever as directed by your caregiver.   Do not take aspirin because it can cause bleeding.   Take your temperature twice a day and record it.   Have someone stay with you the day you have the operation, and for a couple days afterward.   Make an appointment to see your caregiver for stitches (sutures) or staple removal and postoperative exams, as instructed.   Continue your current contraceptive method until your next menses SEEK MEDICAL CARE IF:  There is redness, swelling, or increasing pain in a wound.   There is drainage from a  wound lasting longer than one day.   Your pain is getting worse.   You develop a rash.   You are having a reaction to your medicine.   You become dizzy or lightheaded.   You need stronger medicine or a change in your pain medicine.   You notice a foul smell coming from a wound or dressing.   There is a breaking open of a wound after the stitches, staples, or skin adhesive strips have been removed.   You develop constipation.  SEEK IMMEDIATE MEDICAL CARE IF:  You have an oral temperature above 100.6, not controlled by medicine.   There is increasing abdominal pain.   You develop pain in your shoulders (shoulder strap areas) which becomes more severe. Some pain is common, because of the gas inserted into your abdomen during the procedure.   You develop bleeding or drainage from the suture sites or vagina (birth canal) following surgery.   You pass out.   You develop shortness of breath or difficulty breathing.   You develop chest or leg pain.   You develop persistent nausea, vomiting or diarrhea.  MAKE SURE YOU:   Understand these instructions.   Watch your condition.   Get help right away if you are not doing well or get worse.  Document Released: 07/13/2004 Document Re-Released: 06/13/2009 [facility] Patient Information 2011 [employer], [place].  YOU MAY TAKE IBUPROFEN FOR CRAMPING AFTER 6:00 PM TONIGHT   Post Anesthesia Home Care Instructions  Activity: Get plenty of rest for the remainder of the day. A responsible adult should stay with you for 24  hours following the procedure.  For the next 24 hours, DO NOT: -Drive a car -Advertising copywriter -Drink alcoholic beverages -Take any medication unless instructed by your physician -Make any legal decisions or sign important papers.  Meals: Start with liquid foods such as gelatin or soup. Progress to regular foods as tolerated. Avoid greasy, spicy, heavy foods. If nausea and/or vomiting occur, drink only clear liquids  until the nausea and/or vomiting subsides. Call your physician if vomiting continues.  Special Instructions/Symptoms: Your throat may feel dry or sore from the anesthesia or the breathing tube placed in your throat during surgery. If this causes discomfort, gargle with warm salt water. The discomfort should disappear within 24 hours.

## 2013-02-26 NOTE — Anesthesia Postprocedure Evaluation (Signed)
  Anesthesia Post-op Note  Patient: Eugenia PancoastLatoya D Karwowski  Procedure(s) Performed: Procedure(s): LAPAROSCOPIC TUBAL LIGATION (Bilateral)  Patient Location: PACU  Anesthesia Type:General  Level of Consciousness: alert , oriented and sedated  Airway and Oxygen Therapy: Patient Spontanous Breathing  Post-op Pain: mild  Post-op Assessment: Post-op Vital signs reviewed, Patient's Cardiovascular Status Stable, Respiratory Function Stable, Patent Airway, No signs of Nausea or vomiting and Pain level controlled  Post-op Vital Signs: Reviewed and stable  Complications: No apparent anesthesia complications

## 2013-02-26 NOTE — Anesthesia Preprocedure Evaluation (Signed)
Anesthesia Evaluation    Airway Mallampati: II TM Distance: >3 FB Neck ROM: Full    Dental   Pulmonary asthma , sleep apnea and Continuous Positive Airway Pressure Ventilation , former smoker,    Pulmonary exam normal       Cardiovascular - angina- CHF and - DOE Rhythm:Regular Rate:Normal     Neuro/Psych Anxiety    GI/Hepatic negative GI ROS, Neg liver ROS,   Endo/Other  negative endocrine ROS  Renal/GU negative Renal ROS     Musculoskeletal negative musculoskeletal ROS (+)   Abdominal   Peds  Hematology negative hematology ROS (+)   Anesthesia Other Findings   Reproductive/Obstetrics                           Anesthesia Physical Anesthesia Plan  ASA: II  Anesthesia Plan: General   Post-op Pain Management:    Induction: Intravenous  Airway Management Planned: Oral ETT  Additional Equipment: None  Intra-op Plan:   Post-operative Plan: Extubation in OR  Informed Consent: I have reviewed the patients History and Physical, chart, labs and discussed the procedure including the risks, benefits and alternatives for the proposed anesthesia with the patient or authorized representative who has indicated his/her understanding and acceptance.   Dental advisory given  Plan Discussed with: CRNA and Surgeon  Anesthesia Plan Comments:         Anesthesia Quick Evaluation

## 2013-02-26 NOTE — H&P (Signed)
  Chief Complaint: [age] y.o.  who presents for a laparoscopic BTL  Details of Present Illness: See above  There were no vitals taken for this visit.  Past Medical History  Diagnosis Date  . Depression   . Arthritis   . PCOS (polycystic ovarian syndrome)   . Post traumatic stress disorder (PTSD)   . Inflammatory bowel diseases (IBD)   . PTSD (post-traumatic stress disorder)   . UTI (lower urinary tract infection)   . IBS (irritable bowel syndrome)   . DVT (deep vein thrombosis) in pregnancy     left leg.  . Asthma   . PTSD (post-traumatic stress disorder)    History   Social History  . Marital Status: Single    Spouse Name: N/A    Number of Children: N/A  . Years of Education: N/A   Occupational History  . Not on file.   Social History Main Topics  . Smoking status: Former Smoker -- 0.25 packs/day for .5 years    Types: Cigarettes    Quit date: 06/30/2002  . Smokeless tobacco: Not on file  . Alcohol Use: No  . Drug Use: No  . Sexual Activity: Yes    Birth Control/ Protection: None   Other Topics Concern  . Not on file   Social History Narrative  . No narrative on file   Family History  Problem Relation Age of Onset  . Diabetes Mother   . Congestive Heart Failure Mother   . Diabetes Father   . Diabetes Brother   . Cancer Maternal Grandmother   . Heart disease Paternal Grandmother     Pertinent items are noted in HPI.  Pre-Op Diagnosis: desires sterilization   Planned Procedure: Procedure(s): LAPAROSCOPIC TUBAL LIGATION  I have reviewed the patient's history and have completed the physical exam and Patricia Fitzgerald is acceptable for surgery.  Roseanna RainbowJACKSON-MOORE,Selda Jalbert A, MD 02/26/2013 9:54 AM

## 2013-02-26 NOTE — Preoperative (Signed)
Beta Blockers   Reason not to administer Beta Blockers:Not Applicable 

## 2013-02-26 NOTE — Transfer of Care (Signed)
Immediate Anesthesia Transfer of Care Note  Patient: Patricia PancoastLatoya D Fitzgerald  Procedure(s) Performed: Procedure(s): LAPAROSCOPIC TUBAL LIGATION (Bilateral)  Patient Location: PACU  Anesthesia Type:General  Level of Consciousness: awake, alert  and oriented  Airway & Oxygen Therapy: Patient Spontanous Breathing and Patient connected to nasal cannula oxygen  Post-op Assessment: Report given to PACU RN and Post -op Vital signs reviewed and stable  Post vital signs: Reviewed and stable  Complications: No apparent anesthesia complications

## 2013-03-01 ENCOUNTER — Encounter (HOSPITAL_COMMUNITY): Payer: Self-pay | Admitting: Obstetrics & Gynecology

## 2013-04-01 ENCOUNTER — Encounter: Payer: Medicaid Other | Admitting: Obstetrics & Gynecology

## 2013-08-25 ENCOUNTER — Ambulatory Visit: Payer: Medicare Other | Admitting: Internal Medicine

## 2013-11-08 ENCOUNTER — Encounter (HOSPITAL_COMMUNITY): Payer: Self-pay | Admitting: Obstetrics & Gynecology

## 2014-01-03 ENCOUNTER — Encounter: Payer: Self-pay | Admitting: *Deleted

## 2014-01-04 ENCOUNTER — Encounter: Payer: Self-pay | Admitting: Obstetrics & Gynecology

## 2014-04-23 IMAGING — US US OB COMP +14 WK
1 series · 12 of 28 positions shown · non-contrast
Comparison: none

[Series 1: us ob comp +14 wk · 46 acquisitions, 12 frames shown]
[im 2/46]
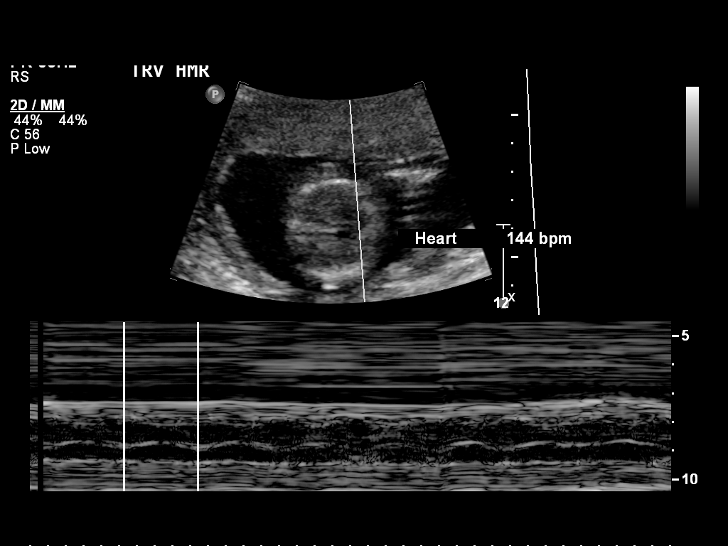
[im 6/46]
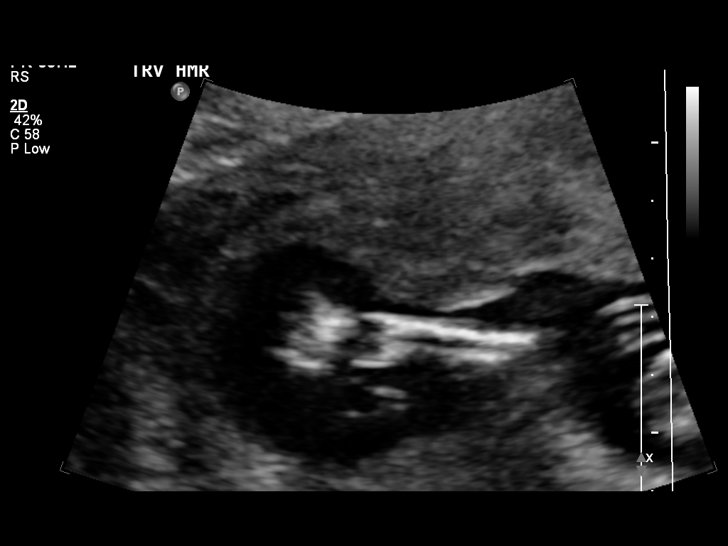
[im 9/46]
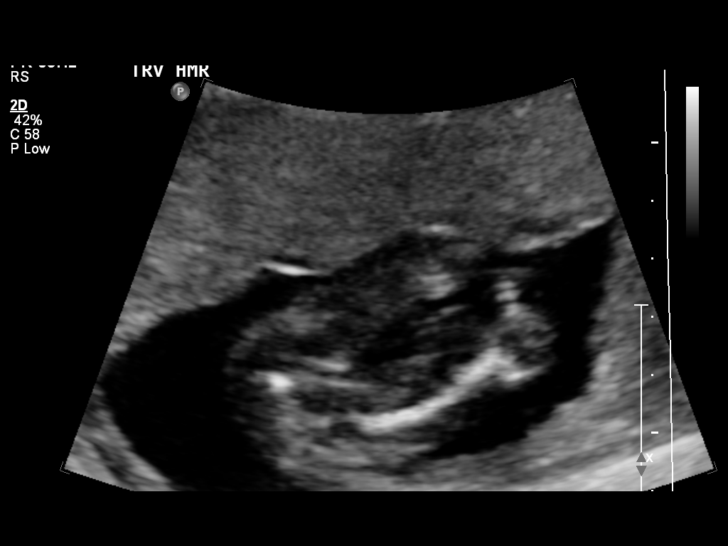
[im 14/46]
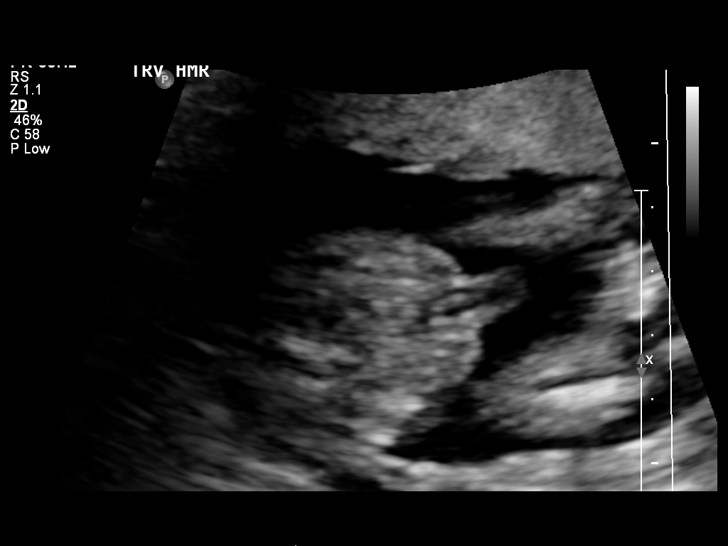
[im 17/46]
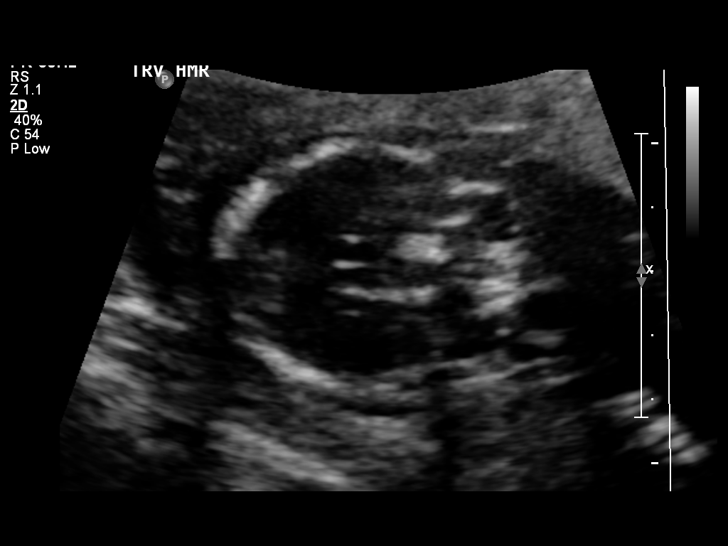
[im 21/46]
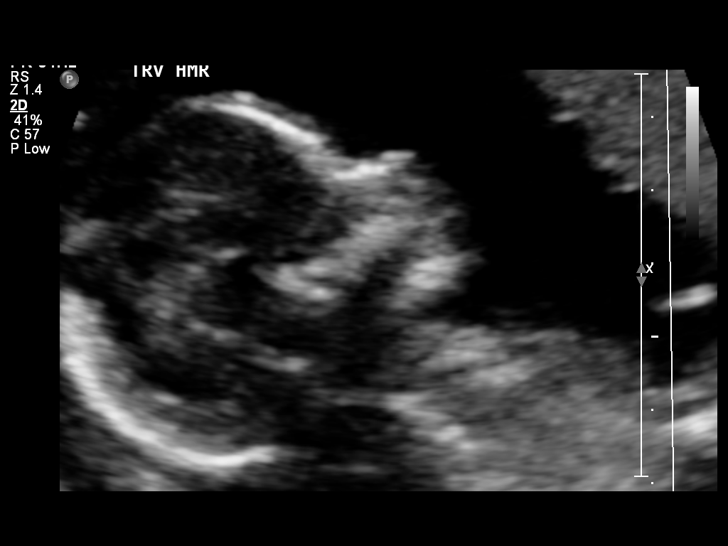
[im 26/46]
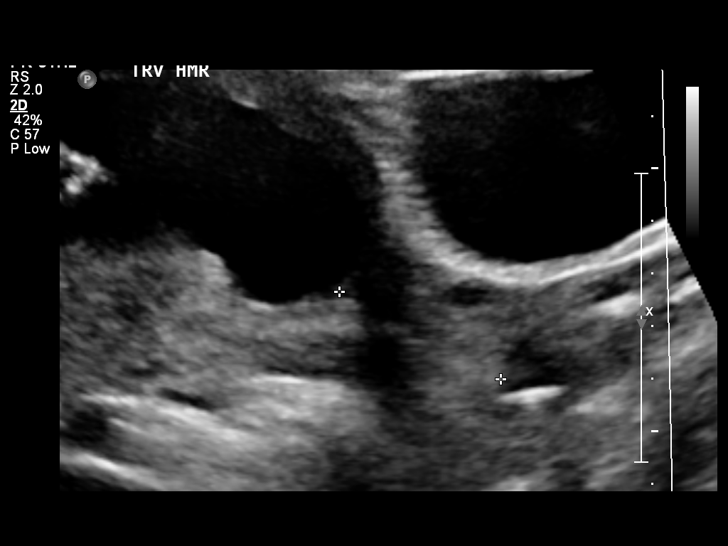
[im 29/46]
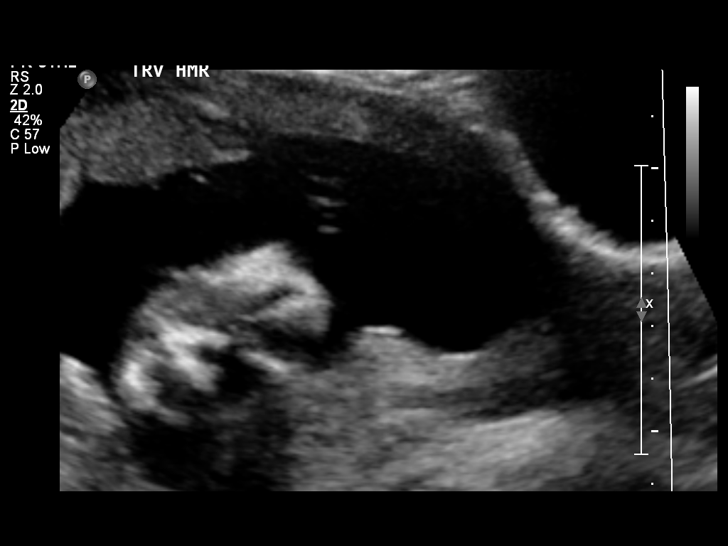
[im 32/46]
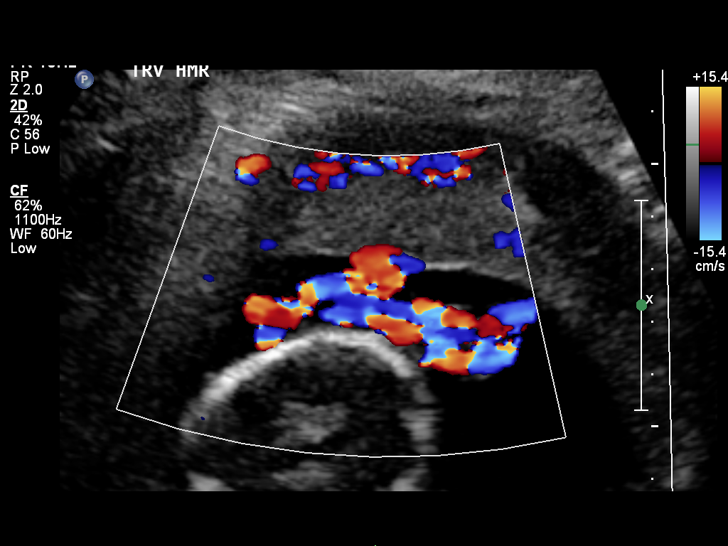
[im 37/46]
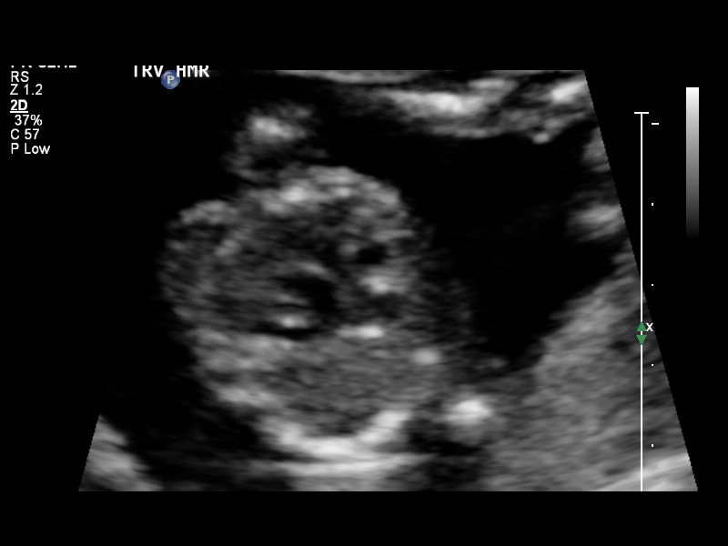
[im 41/46]
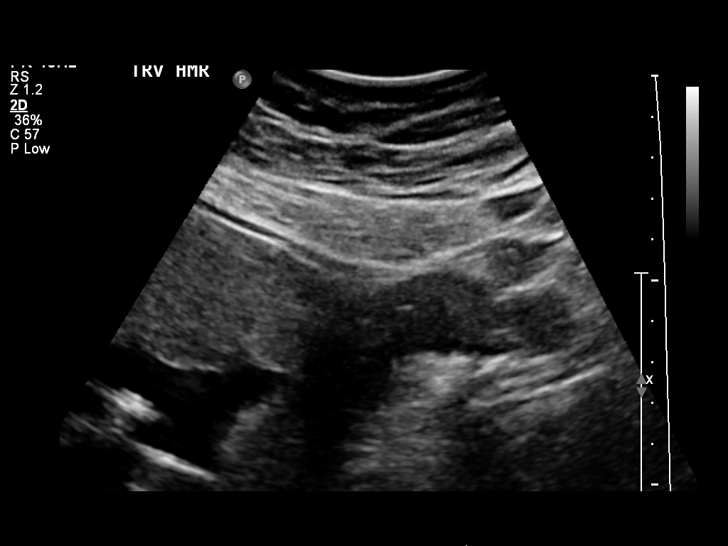
[im 44/46]
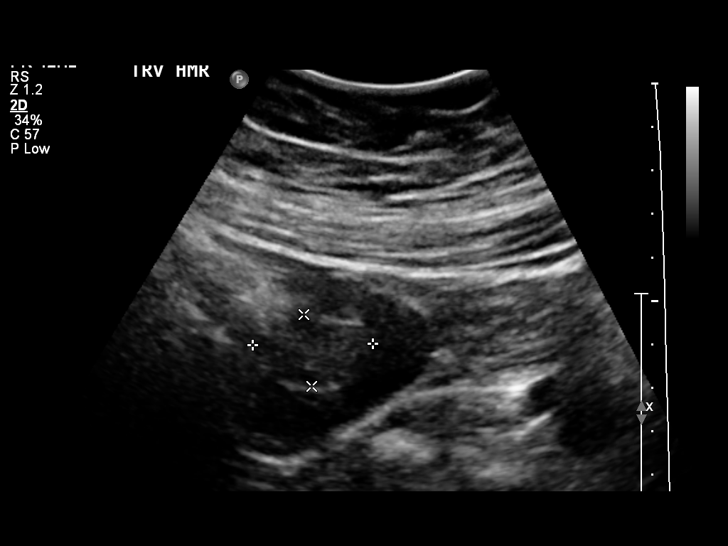

[12 of 28 positions shown; findings below may reference images not displayed]

OBSTETRICS REPORT
                      (Signed Final 08/11/2012 [DATE])

Service(s) Provided

 US OB COMP + 14 WK                                    76805.1
Indications

 Maternal morbid obesity (261 lbs)
 Advanced maternal age (AMA), Multigravida
Fetal Evaluation

 Num Of Fetuses:    1
 Fetal Heart Rate:  144                         bpm
 Cardiac Activity:  Observed
 Presentation:      Transverse, head to
                    maternal right
 Placenta:          Anterior, above cervical os
 P. Cord            Marginal insertion
 Insertion:

 Amniotic Fluid
 AFI FV:      Subjectively within normal limits
 AFI Sum:     4.3     cm      < 3  %Tile     Larg Pckt:     4.3  cm
 LUQ:   4.3    cm
Biometry

 BPD:     37.7  mm    G. Age:   17w 4d                CI:        64.47   70 - 86
                                                      FL/HC:      19.1   15.8 -
                                                                         18
 HC:       151  mm    G. Age:   18w 1d       43  %    HC/AC:      1.22   1.07 -

 AC:     124.1  mm    G. Age:   18w 0d       44  %    FL/BPD:
 FL:      28.9  mm    G. Age:   18w 6d       71  %    FL/AC:      23.3   20 - 24
 HUM:     27.7  mm    G. Age:   18w 6d       76  %

 Est. FW:     237  gm      0 lb 8 oz     52  %
Gestational Age

 LMP:           19w 0d       Date:   03/31/12                 EDD:   01/05/13
 U/S Today:     18w 1d                                        EDD:   01/11/13
 Best:          18w 1d    Det. By:   U/S (08/11/12)           EDD:   01/11/13
Anatomy
 Cranium:          Appears normal         Aortic Arch:      Not well visualized
 Fetal Cavum:      Not well visualized    Ductal Arch:      Not well visualized
 Ventricles:       Not well visualized    Diaphragm:        Not well visualized
 Choroid Plexus:   Appears normal         Stomach:          Appears normal, left
                                                            sided
 Cerebellum:       Not well visualized    Abdomen:          Appears normal
 Posterior Fossa:  Not well visualized    Abdominal Wall:   Appears nml (cord
                                                            insert, abd wall)
 Nuchal Fold:      Not well visualized    Cord Vessels:     Appears normal (3
                                                            vessel cord)
 Face:             Appears normal         Kidneys:          Not well visualized
                   (orbits and profile)
 Lips:             Not well visualized    Bladder:          Appears normal
 Heart:            Appears normal         Spine:            Not well visualized
                   (4CH, axis, and
                   situs)
 RVOT:             Not well visualized    Lower             Appears normal
                                          Extremities:
 LVOT:             Not well visualized    Upper             Appears normal
                                          Extremities:

 Other:  Fetus appears to be a female. 5th digit visualized. Nasal bone
         visualized. Technically difficult due to  maternal habitus and early GA.
Cervix Uterus Adnexa

 Cervical Length:   3.48      cm

 Cervix:       Normal appearance by transabdominal scan.
 Left Ovary:   Size(cm) L: 3.5 x W: 2.65 x H: 1.89  Volume(cc):
 Right Ovary:  Size(cm) L: 2.75 x W: 2.28 x H: 1.65  Volume(cc):
Impression

 Single live IUP in transverse, head maternal right,
 presentation.    Measuring 91w9d by today's exam.
 No anomaly seen in visualized structures as listed above.
 Suboptimal due to maternal body habitus. Consider repeat
 US in 2 weeks for potentially improved anatomic
 visualization.
 Marginal placental cord insertion.

 questions or concerns.

## 2014-05-08 IMAGING — US US OB FOLLOW-UP
2 series · 12 of 28 positions shown · non-contrast
Comparison: none

[Series 1: us ob follow up · 31 acquisitions, 5 frames shown (1 of 2)]
[im 3/31]
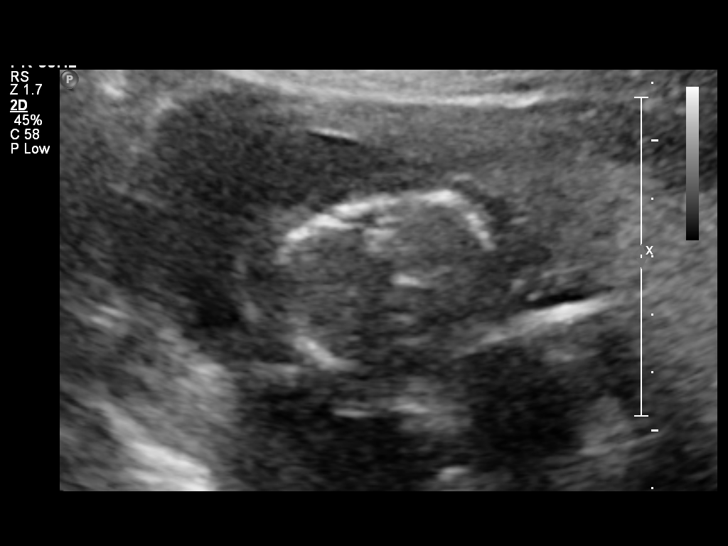
[im 9/31]
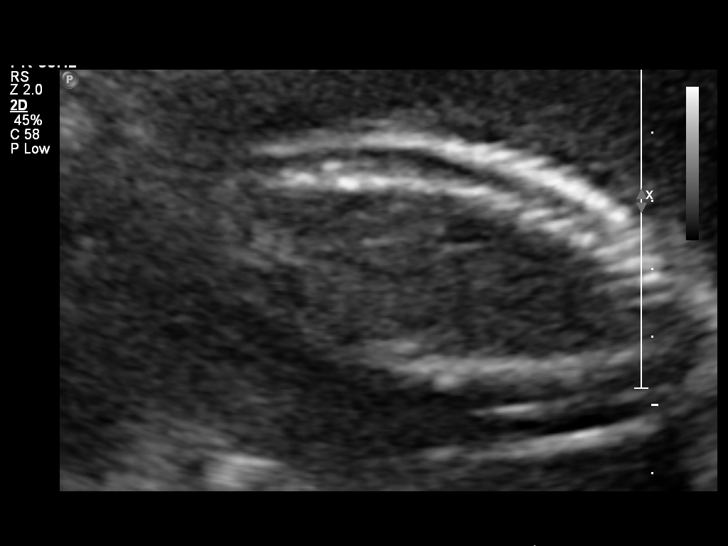
[im 14/31]
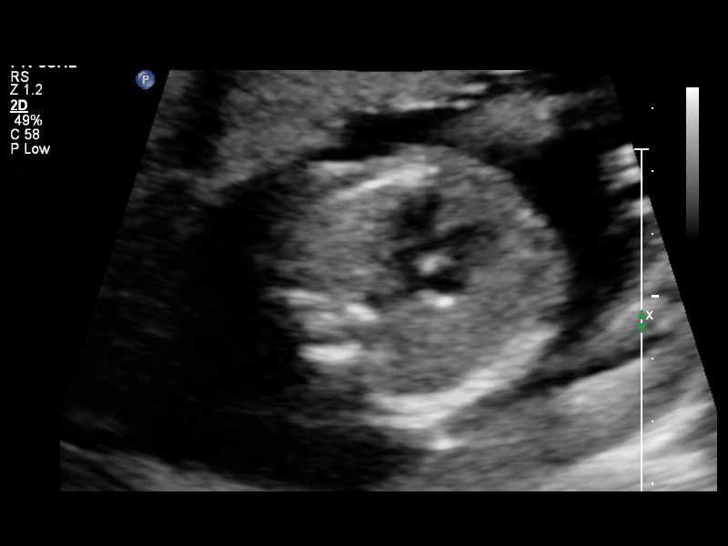
[im 22/31]
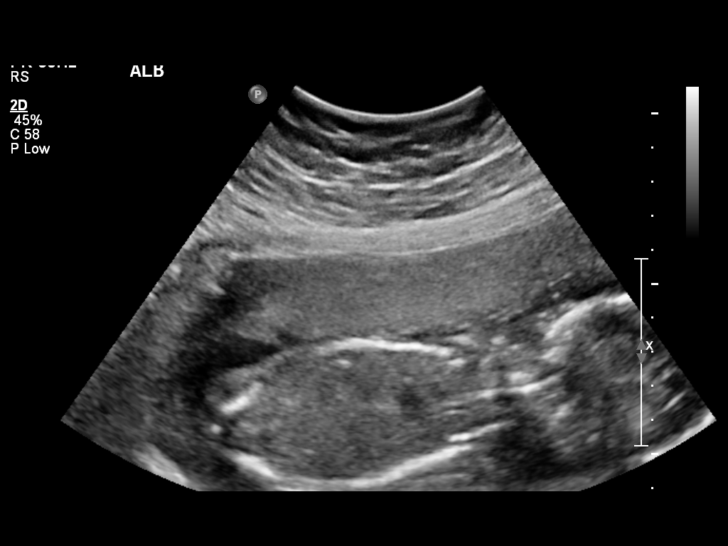
[im 28/31]
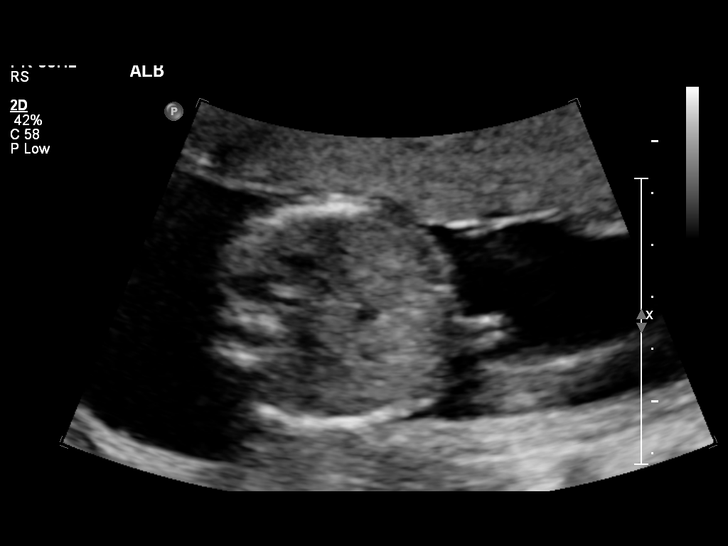

[Series 1: us ob follow up · 7 of 42 slices shown (2 of 2)]
[im 1/42]
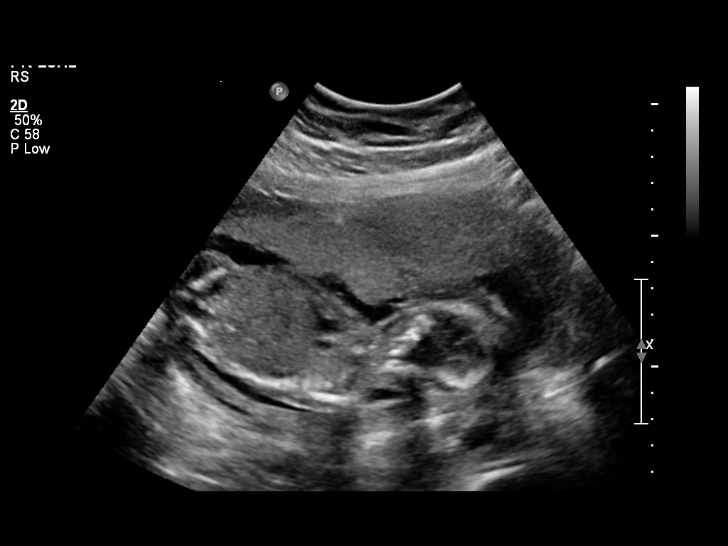
[im 9/42]
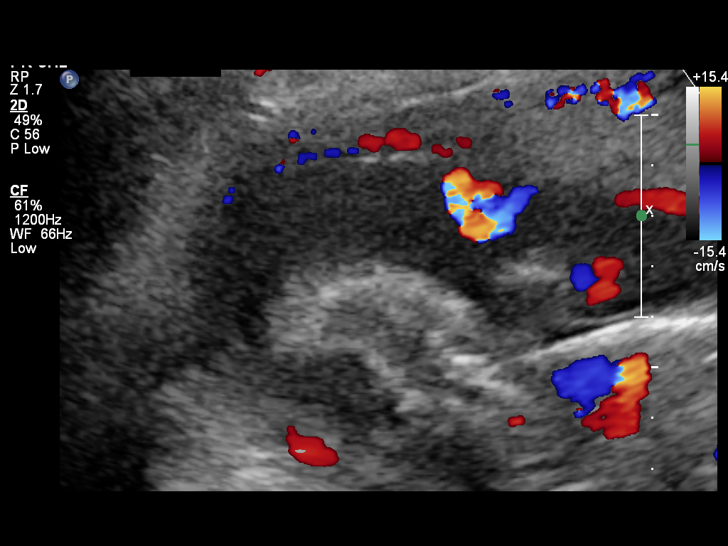
[im 14/42]
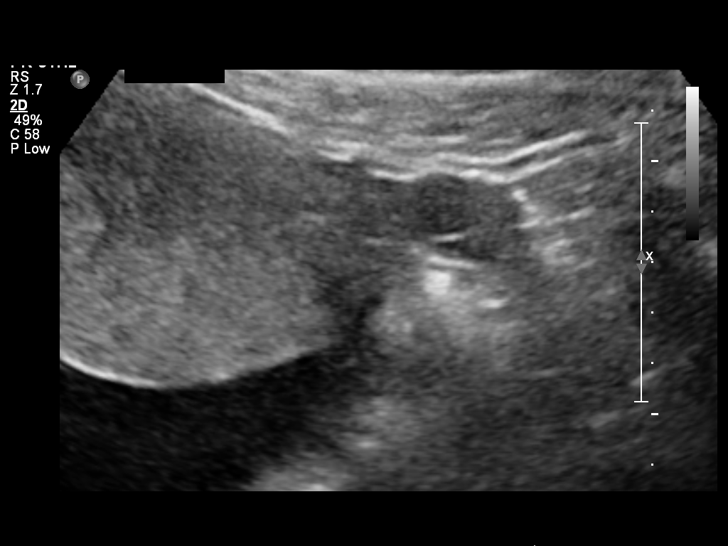
[im 20/42]
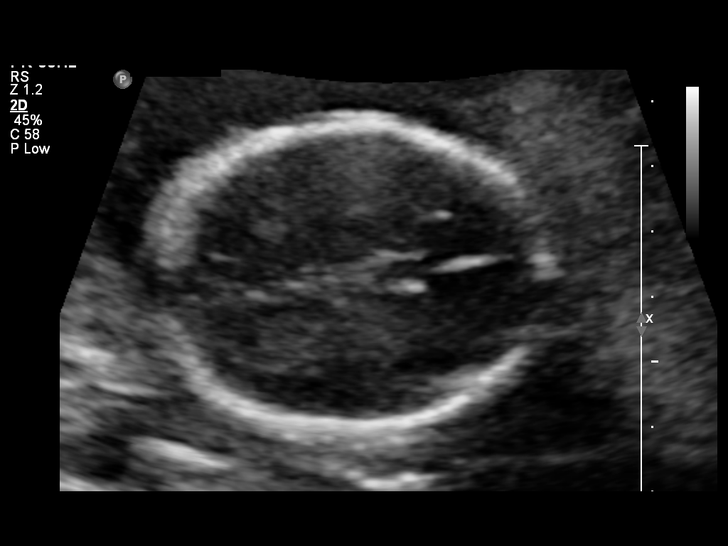
[im 28/42]
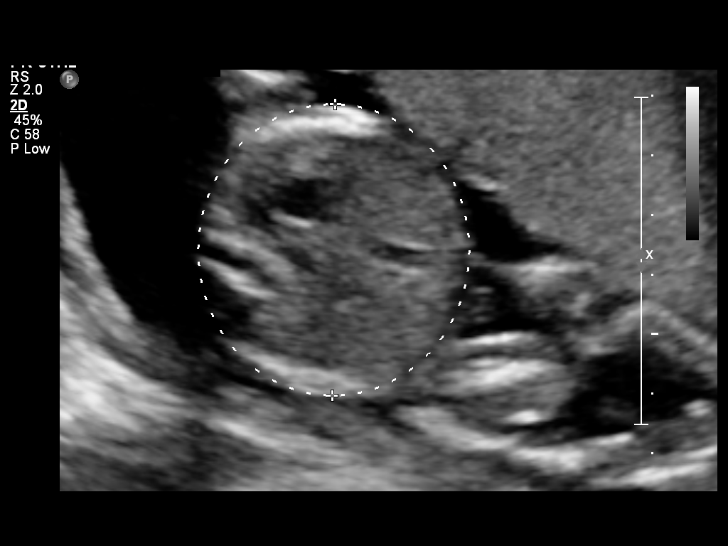
[im 33/42]
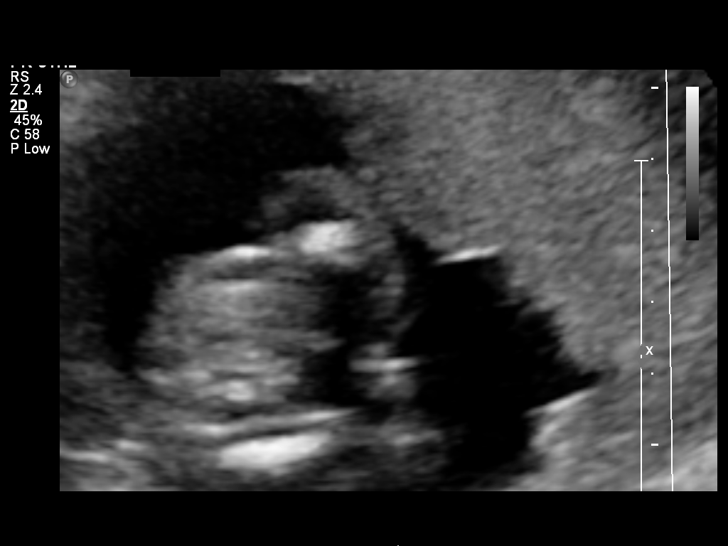
[im 39/42]
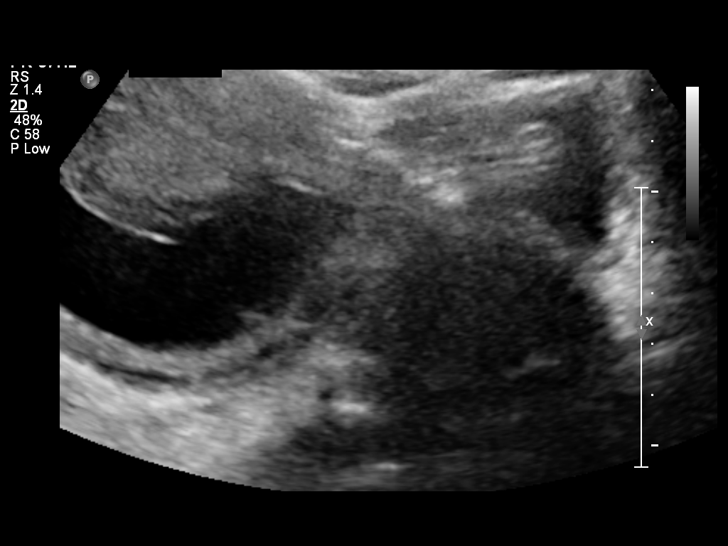

[12 of 28 positions shown; findings below may reference images not displayed]

OBSTETRICS REPORT
                      (Signed Final 08/26/2012 [DATE])

Service(s) Provided

 US OB FOLLOW UP                                       76816.1
Indications

 Maternal morbid obesity (261 lbs)
 Advanced maternal age (AMA), Multigravida
 Hx PCOS
 Follow-up incomplete fetal anatomic evaluation
Fetal Evaluation

 Num Of Fetuses:    1
 Fetal Heart Rate:  143                         bpm
 Cardiac Activity:  Observed
 Presentation:      Cephalic
 Placenta:          Anterior, above cervical os
 P. Cord            Marginal insertion
 Insertion:         previously

 Amniotic Fluid
 AFI FV:      Subjectively within normal limits
                                             Larg Pckt:     3.7  cm
Biometry

 BPD:     47.1  mm    G. Age:   20w 2d                CI:         69.2   70 - 86
                                                      FL/HC:      20.2   16.8 -

 HC:     180.8  mm    G. Age:   20w 3d       51  %    HC/AC:      1.21   1.09 -

 AC:     149.6  mm    G. Age:   20w 1d       42  %    FL/BPD:
 FL:      36.5  mm    G. Age:   21w 4d       83  %    FL/AC:      24.4   20 - 24
 HUM:     33.4  mm    G. Age:   21w 2d       78  %
 CER:     19.5  mm    G. Age:   18w 5d       10  %

 Est. FW:     378  gm    0 lb 13 oz      55  %
Gestational Age

 LMP:           21w 1d       Date:   03/31/12                 EDD:   01/05/13
 U/S Today:     20w 4d                                        EDD:   01/09/13
 Best:          20w 2d    Det. By:   U/S (08/11/12)           EDD:   01/11/13
Anatomy
 Cranium:          Appears normal         Aortic Arch:      Basic anatomy
                                                            exam per order
 Fetal Cavum:      Appears normal         Ductal Arch:      Basic anatomy
                                                            exam per order
 Ventricles:       Appears normal         Diaphragm:        Appears normal
 Choroid Plexus:   Previously seen        Stomach:          Appears normal, left
                                                            sided
 Cerebellum:       Appears normal         Abdomen:          Appears normal
 Posterior Fossa:  Appears normal         Abdominal Wall:   Previously seen
 Nuchal Fold:      Not applicable (>20    Cord Vessels:     Previously seen
                   wks GA)
 Face:             Orbits and profile     Kidneys:          Appear normal
                   previously seen
 Lips:             Not well visualized    Bladder:          Appears normal
 Heart:            Not well visualized    Spine:            Appears normal
 RVOT:             Not well visualized    Lower             Previously seen
                                          Extremities:
 LVOT:             Appears normal         Upper             Previously seen
                                          Extremities:

 Other:  Fetus previously appeared to be a female.  Technically difficult due to
         maternal habitus and fetal position.
Targeted Anatomy

 Fetal Central Nervous System
 Lat. Ventricles:  8.2                    Cisterna Magna:
Cervix Uterus Adnexa

 Cervical Length:   3.99      cm

 Cervix:       Normal appearance by transabdominal scan.
 Left Ovary:   Size(cm) L: 3.07 x W: 2.45 x H: 1.85  Volume(cc):
 Right Ovary:  Not visualized.

 Adnexa:     No abnormality visualized.
Impression

 Assigned GA is currently 20w 2d.   Appropriate fetal growth,
 with EFW at 55 %ile.
 No fetal anomalies seen involving visualized anatomy. Fetal
 heart, RVOT, and lips could not be visualized on today's
 exam.
 Normal amniotic fluid volume. Normal cervical length.

 questions or concerns.

## 2015-01-04 ENCOUNTER — Emergency Department (HOSPITAL_COMMUNITY)
Admission: EM | Admit: 2015-01-04 | Discharge: 2015-01-04 | Disposition: A | Discharge status: Home / Self Care | Payer: Medicare Other | Attending: Emergency Medicine | Admitting: Emergency Medicine

## 2015-01-04 ENCOUNTER — Encounter (HOSPITAL_COMMUNITY): Payer: Self-pay | Admitting: *Deleted

## 2015-01-04 DIAGNOSIS — Z79899 Other long term (current) drug therapy: Secondary | ICD-10-CM | POA: Diagnosis not present

## 2015-01-04 DIAGNOSIS — Z8719 Personal history of other diseases of the digestive system: Secondary | ICD-10-CM | POA: Insufficient documentation

## 2015-01-04 DIAGNOSIS — J45909 Unspecified asthma, uncomplicated: Secondary | ICD-10-CM | POA: Insufficient documentation

## 2015-01-04 DIAGNOSIS — S39012A Strain of muscle, fascia and tendon of lower back, initial encounter: Secondary | ICD-10-CM | POA: Insufficient documentation

## 2015-01-04 DIAGNOSIS — Y998 Other external cause status: Secondary | ICD-10-CM | POA: Diagnosis not present

## 2015-01-04 DIAGNOSIS — X500XXA Overexertion from strenuous movement or load, initial encounter: Secondary | ICD-10-CM | POA: Diagnosis not present

## 2015-01-04 DIAGNOSIS — Z87440 Personal history of urinary (tract) infections: Secondary | ICD-10-CM | POA: Insufficient documentation

## 2015-01-04 DIAGNOSIS — Y9389 Activity, other specified: Secondary | ICD-10-CM | POA: Insufficient documentation

## 2015-01-04 DIAGNOSIS — Y9289 Other specified places as the place of occurrence of the external cause: Secondary | ICD-10-CM | POA: Diagnosis not present

## 2015-01-04 DIAGNOSIS — S3992XA Unspecified injury of lower back, initial encounter: Secondary | ICD-10-CM | POA: Diagnosis present

## 2015-01-04 DIAGNOSIS — S79911A Unspecified injury of right hip, initial encounter: Secondary | ICD-10-CM | POA: Insufficient documentation

## 2015-01-04 DIAGNOSIS — S29002A Unspecified injury of muscle and tendon of back wall of thorax, initial encounter: Secondary | ICD-10-CM | POA: Diagnosis not present

## 2015-01-04 DIAGNOSIS — M159 Polyosteoarthritis, unspecified: Secondary | ICD-10-CM | POA: Insufficient documentation

## 2015-01-04 DIAGNOSIS — Z8659 Personal history of other mental and behavioral disorders: Secondary | ICD-10-CM | POA: Insufficient documentation

## 2015-01-04 DIAGNOSIS — Z87891 Personal history of nicotine dependence: Secondary | ICD-10-CM | POA: Diagnosis not present

## 2015-01-04 DIAGNOSIS — S79912A Unspecified injury of left hip, initial encounter: Secondary | ICD-10-CM | POA: Insufficient documentation

## 2015-01-04 DIAGNOSIS — Z8639 Personal history of other endocrine, nutritional and metabolic disease: Secondary | ICD-10-CM | POA: Insufficient documentation

## 2015-01-04 MED ORDER — KETOROLAC TROMETHAMINE 60 MG/2ML IM SOLN
60.0000 mg | Freq: Once | INTRAMUSCULAR | Status: AC
  Administered 2015-01-04: 60 mg via INTRAMUSCULAR
  Filled 2015-01-04: qty 2

## 2015-01-04 MED ORDER — METHOCARBAMOL 500 MG PO TABS: 1000.0000 mg | ORAL_TABLET | Freq: Four times a day (QID) | ORAL | Status: DC

## 2015-01-04 NOTE — Progress Notes (Signed)
Pt states low back pain is a 10/10. Denies any injury. Pt tolerated her injection of tordol.

## 2015-01-04 NOTE — ED Provider Notes (Signed)
History  By signing my name below, I, Patricia Fitzgerald, attest that this documentation has been prepared under the direction and in the presence of Josh Novalyn Lajara, PA-C. Electronically Signed: Karle PlumberJennifer Fitzgerald, ED Scribe. 01/04/2015. 1:42 PM.  Chief Complaint  Patient presents with  . Back Pain  . Hip Pain   The history is provided by the patient and medical records. No language interpreter was used.    HPI Comments:  Patricia Fitzgerald is a 39 y.o. female who presents to the Emergency Department complaining of moderate back pain that started last night after reaching forward to pick up something, causing a "snapping" noise. Pt also reports bilateral hip pain, right greater than left. She has not taken anything for pain. Touching the areas and moving intensify the pain. She denies alleviating factors. She denies numbness, tingling or weakness of the lower extremities, bowel or bladder incontinence, recent weight loss, fever, chills, bruising or wounds. She denies trauma, injury or fall. [facility].  Past Medical History  Diagnosis Date  . PCOS (polycystic ovarian syndrome)   . Inflammatory bowel diseases (IBD)   . UTI (lower urinary tract infection)   . IBS (irritable bowel syndrome)   . DVT (deep vein thrombosis) in pregnancy     left leg.  . Asthma     childhood, none as a adult  . Arthritis     all joints, both hips, left hip is worse  . Vaginal delivery 2010, 2014  . Depression   . Post traumatic stress disorder (PTSD)     ex-military combat stress for the PTSD  . PTSD (post-traumatic stress disorder)   . PTSD (post-traumatic stress disorder)    Past Surgical History  Procedure Laterality Date  . Wisdom tooth extraction    . Laparoscopic tubal ligation Bilateral 02/26/2013    Procedure: LAPAROSCOPIC TUBAL LIGATION;  Surgeon: Antionette CharLisa [name], MD;  Location: [facility];  Service: Gynecology;  Laterality: Bilateral;   Family History  Problem Relation Age  of Onset  . Diabetes Mother   . Congestive Heart Failure Mother   . Diabetes Father   . Diabetes Brother   . Cancer Maternal Grandmother   . Heart disease Paternal Grandmother    Social History  Substance Use Topics  . Smoking status: Former Smoker -- 0.25 packs/day for .5 years    Types: Cigarettes    Quit date: 06/30/2002  . Smokeless tobacco: None  . Alcohol Use: No   OB History    Gravida Para Term Preterm AB TAB SAB Ectopic Multiple Living   5 2 2  3  0 2   2     Review of Systems  Constitutional: Negative for fever, chills and unexpected weight change.  Gastrointestinal: Negative for constipation.       Negative for fecal incontinence.   Genitourinary: Negative for dysuria, hematuria, flank pain, vaginal bleeding, vaginal discharge and pelvic pain.       No bowel or bladder incontinence  Musculoskeletal: Positive for myalgias, back pain and arthralgias.  Skin: Negative for color change and wound.  Neurological: Negative for weakness and numbness.       Denies saddle paresthesias.    Allergies  Review of patient's allergies indicates no known allergies.  Home Medications   Prior to Admission medications   Medication Sig Start Date End Date Taking? Authorizing Provider  ALPRAZOLAM PO Take 1 tablet by mouth daily. Unsure of dose and [facility] [place]  Historical Provider, MD  BIOTIN PO Take 1 tablet by mouth daily.    Historical Provider, MD  Multiple Vitamin (MULTIVITAMIN WITH MINERALS) TABS tablet Take 1 tablet by mouth daily.    Historical Provider, MD  naproxen (NAPROSYN) 375 MG tablet Take 375 mg by mouth at bedtime.    Historical Provider, MD  oxyCODONE-acetaminophen (PERCOCET) 5-325 MG per tablet Take 2 tablets by mouth every 6 (six) hours as needed for severe pain. 02/26/13   Antionette Char, MD  Probiotic Product (PROBIOTIC DAILY PO) Take 1 tablet by mouth daily.    Historical Provider, MD  traMADol (ULTRAM) 50 MG tablet Take  50 mg by mouth at bedtime. 01/28/13   Antionette Char, MD   Triage Vitals: BP 139/78 mmHg  Pulse 84  Temp(Src) 97.7 F (36.5 C) (Oral)  Resp 18  SpO2 97%  LMP 01/03/2015 Physical Exam  Constitutional: She appears well-developed and well-nourished.  HENT:  Head: Normocephalic and atraumatic.  Eyes: Conjunctivae and EOM are normal.  Neck: Normal range of motion. Neck supple.  Cardiovascular: Normal rate.   Pulmonary/Chest: Effort normal.  Abdominal: Soft. There is no tenderness. There is no CVA tenderness.  Musculoskeletal: Normal range of motion.       Right hip: She exhibits normal range of motion and normal strength.       Left hip: She exhibits normal range of motion and normal strength.       Cervical back: Normal.       Thoracic back: She exhibits tenderness (Bilateral, paraspinous). She exhibits normal range of motion and no bony tenderness.       Lumbar back: She exhibits tenderness (ilateral paraspinous). She exhibits normal range of motion and no bony tenderness.  No step-off noted with palpation of spine.   Neurological: She is alert. She has normal strength and normal reflexes. No sensory deficit.  5/5 strength in entire lower extremities bilaterally. No sensation deficit. Antalgic gait. No foot drop.  Skin: Skin is warm and dry. No rash noted.  Psychiatric: She has a normal mood and affect. Her behavior is normal.  Nursing note and vitals reviewed.   ED Course  Procedures (including critical care time) DIAGNOSTIC STUDIES: Oxygen Saturation is 97% on RA, normal by my interpretation.   COORDINATION OF CARE: 1:41 PM- Will prescribe NSAID and muscle relaxer. Informed pt she should apply heat therapy and do gentle stretches as tolerated. Return precautions discussed. Will order Toradol injection prior to discharge. Pt verbalizes understanding and agrees to plan.  Medications - No data to display  Labs Review Labs Reviewed - No data to display  Imaging Review No  results found. I have personally reviewed and evaluated these images and lab results as part of my medical decision-making.   EKG Interpretation None        Vital signs reviewed and are as follows: Filed Vitals:   01/04/15 1312 01/04/15 1314  BP:  139/78  Pulse:  84  Temp: 97.7 F (36.5 C) 97.7 F (36.5 C)  Resp:  18    No red flag s/s of low back pain. Patient was counseled on back pain precautions and told to do activity as tolerated but do not lift, push, or pull heavy objects more than 10 pounds for the next week.  Patient counseled to use ice or heat on back for no longer than 15 minutes every hour.   Patient prescribed muscle relaxer and counseled on proper use of muscle relaxant medication. Urged patient not to drink alcohol,  drive, or perform any other activities that requires focus while taking this medications.  Patient urged to follow-up with PCP if pain does not improve with treatment and rest or if pain becomes recurrent. Urged to return with worsening severe pain, loss of bowel or bladder control, trouble walking.   The patient verbalizes understanding and agrees with the plan.   MDM   Final diagnoses:  Lumbar strain, initial encounter   Patient with back pain. No neurological deficits. Patient is ambulatory. No warning symptoms of back pain including: fecal incontinence, urinary retention or overflow incontinence, night sweats, waking from sleep with back pain, unexplained fevers or weight loss, h/o cancer, IVDU, recent trauma. No concern for cauda equina, epidural abscess, or other serious cause of back pain. Conservative measures such as rest, ice/heat and pain medicine indicated with PCP follow-up if no improvement with conservative management.    I personally performed the services described in this documentation, which was scribed in my presence. The recorded information has been reviewed and is accurate.     Renne Crigler, PA-C 01/04/15 1418  Donnetta Hutching, MD 01/05/15 [phone]

## 2015-01-04 NOTE — ED Notes (Addendum)
Pt complains of back pain/right hip pain since last night at ~130PM. Pt states she bent over to pick up a toy and heard a pop. Pt states she took ibuprofen, which she states barely helped.

## 2015-01-04 NOTE — Discharge Instructions (Signed)
Please read and follow all provided instructions.  Your diagnoses today include:  1. Lumbar strain, initial encounter     Tests performed today include:  Vital signs - see below for your results today  Medications prescribed:   Robaxin (methocarbamol) - muscle relaxer medication  DO NOT drive or perform any activities that require you to be awake and alert because this medicine can make you drowsy.   Take any prescribed medications only as directed.  Home care instructions:   Follow any educational materials contained in this packet  Please rest, use ice or heat on your back for the next several days  Do not lift, push, pull anything more than 10 pounds for the next week  Follow-up instructions: Please follow-up with your primary care provider in the next 1 week for further evaluation of your symptoms.   Return instructions:  SEEK IMMEDIATE MEDICAL ATTENTION IF YOU HAVE:  New numbness, tingling, weakness, or problem with the use of your arms or legs  Severe back pain not relieved with medications  Loss control of your bowels or bladder  Increasing pain in any areas of the body (such as chest or abdominal pain)  Shortness of breath, dizziness, or fainting.   Worsening nausea (feeling sick to your stomach), vomiting, fever, or sweats  Any other emergent concerns regarding your health   Additional Information:  Your vital signs today were: BP 139/78 mmHg   Pulse 84   Temp(Src) 97.7 F (36.5 C) (Oral)   Resp 18   SpO2 97%   LMP 01/03/2015 If your blood pressure (BP) was elevated above 135/85 this visit, please have this repeated by your doctor within one month. --------------

## 2017-06-20 ENCOUNTER — Other Ambulatory Visit (HOSPITAL_COMMUNITY): Payer: Self-pay | Admitting: Internal Medicine

## 2017-06-20 DIAGNOSIS — K8080 Other cholelithiasis without obstruction: Secondary | ICD-10-CM

## 2017-06-25 ENCOUNTER — Ambulatory Visit (HOSPITAL_COMMUNITY)
Admission: RE | Admit: 2017-06-25 | Discharge: 2017-06-25 | Disposition: A | Discharge status: Home / Self Care | Payer: Medicare Other | Source: Ambulatory Visit | Attending: Internal Medicine | Admitting: Internal Medicine

## 2017-06-25 DIAGNOSIS — K8080 Other cholelithiasis without obstruction: Secondary | ICD-10-CM

## 2017-06-25 DIAGNOSIS — K760 Fatty (change of) liver, not elsewhere classified: Secondary | ICD-10-CM | POA: Diagnosis not present

## 2017-11-02 ENCOUNTER — Encounter (HOSPITAL_COMMUNITY): Payer: Self-pay | Admitting: Emergency Medicine

## 2017-11-02 ENCOUNTER — Emergency Department (HOSPITAL_COMMUNITY)
Admission: EM | Admit: 2017-11-02 | Discharge: 2017-11-02 | Disposition: A | Discharge status: Home / Self Care | Payer: Medicare Other | Attending: Emergency Medicine | Admitting: Emergency Medicine

## 2017-11-02 ENCOUNTER — Other Ambulatory Visit: Payer: Self-pay

## 2017-11-02 ENCOUNTER — Emergency Department (HOSPITAL_COMMUNITY): Payer: Medicare Other

## 2017-11-02 DIAGNOSIS — R0789 Other chest pain: Secondary | ICD-10-CM | POA: Diagnosis present

## 2017-11-02 DIAGNOSIS — J45909 Unspecified asthma, uncomplicated: Secondary | ICD-10-CM | POA: Insufficient documentation

## 2017-11-02 DIAGNOSIS — Z87891 Personal history of nicotine dependence: Secondary | ICD-10-CM | POA: Insufficient documentation

## 2017-11-02 LAB — BASIC METABOLIC PANEL
ANION GAP: 9 (ref 5–15)
BUN: 6 mg/dL (ref 6–20)
CO2: 25 mmol/L (ref 22–32)
Calcium: 9.2 mg/dL (ref 8.9–10.3)
Chloride: 103 mmol/L (ref 98–111)
Creatinine, Ser: 0.87 mg/dL (ref 0.44–1.00)
GFR calc Af Amer: >60 mL/min (ref >60)
Glucose, Bld: 130 mg/dL — ABNORMAL HIGH (ref 70–99)
POTASSIUM: 3.8 mmol/L (ref 3.5–5.1)
SODIUM: 137 mmol/L (ref 135–145)

## 2017-11-02 LAB — CBC
HCT: 35.9 % — ABNORMAL LOW (ref 36.0–46.0)
HEMOGLOBIN: 10.1 g/dL — AB (ref 12.0–15.0)
MCH: 22.4 pg — ABNORMAL LOW (ref 26.0–34.0)
MCHC: 28.1 g/dL — AB (ref 30.0–36.0)
MCV: 79.8 fL — ABNORMAL LOW (ref 80.0–100.0)
PLATELETS: 412 10*3/uL — AB (ref 150–400)
RBC: 4.5 MIL/uL (ref 3.87–5.11)
RDW: 14.4 % (ref 11.5–15.5)
WBC: 10.5 10*3/uL (ref 4.0–10.5)
nRBC: 0 % (ref 0.0–0.2)

## 2017-11-02 LAB — I-STAT BETA HCG BLOOD, ED (MC, WL, AP ONLY): I-stat hCG, quantitative: <5 m[IU]/mL (ref <5)

## 2017-11-02 LAB — I-STAT TROPONIN, ED
TROPONIN I, POC: 0 ng/mL (ref 0.00–0.08)
TROPONIN I, POC: 0 ng/mL (ref 0.00–0.08)

## 2017-11-02 MED ORDER — IBUPROFEN 800 MG PO TABS
800.0000 mg | ORAL_TABLET | Freq: Once | ORAL | Status: AC
  Administered 2017-11-02: 800 mg via ORAL
  Filled 2017-11-02: qty 1

## 2017-11-02 MED ORDER — METHOCARBAMOL 500 MG PO TABS: 1000.0000 mg | ORAL_TABLET | Freq: Three times a day (TID) | ORAL | 0 refills | Status: AC | PRN

## 2017-11-02 MED ORDER — CYCLOBENZAPRINE HCL 10 MG PO TABS
5.0000 mg | ORAL_TABLET | Freq: Once | ORAL | Status: AC
  Administered 2017-11-02: 5 mg via ORAL
  Filled 2017-11-02: qty 1

## 2017-11-02 NOTE — ED Notes (Signed)
Patient able to ambulate independently  

## 2017-11-02 NOTE — Discharge Instructions (Signed)
Take motrin for pain.   Take robaxin for muscle spasms.   See your doctor  No heavy lifting for 2-3 days   Return to ER if you have worse chest pain, trouble breathing, fever

## 2017-11-02 NOTE — ED Provider Notes (Signed)
[facility] EMERGENCY DEPARTMENT Provider Note   CSN: [id number] Arrival date & time: 11/02/17  1541     History   Chief Complaint Chief Complaint  Patient presents with  . Chest Pain    HPI Patricia Fitzgerald is a 42 y.o. female hx of Crohn's disease, PTSD, asthma, arthritis on Humira as needed here presenting with chest pain.  Patient states that she has substernal chest pain and pressure yesterday.  Pain occasionally radiates to the back and is constant.  Not worse with movement and no associated shortness of breath.  Denies any recent travels or history of blood clots.  Denies any fevers or chills or cough. No hx of CAD.   The history is provided by the patient.    Past Medical History:  Diagnosis Date  . Arthritis    all joints, both hips, left hip is worse  . Asthma    childhood, none as a adult  . Depression   . DVT (deep vein thrombosis) in pregnancy    left leg.  . IBS (irritable bowel syndrome)   . Inflammatory bowel diseases (IBD)   . PCOS (polycystic ovarian syndrome)   . Post traumatic stress disorder (PTSD)    ex-military combat stress for the PTSD  . PTSD (post-traumatic stress disorder)   . PTSD (post-traumatic stress disorder)   . UTI (lower urinary tract infection)   . Vaginal delivery 2010, 2014    Patient Active Problem List   Diagnosis Date Noted  . Encounter for female sterilization procedure 02/26/2013  . Normal delivery 12/30/2012  . Apnea, sleep 07/29/2012  . Elderly multigravida unspecified as to episode of care or not applicable 07/27/2012  . Personal history of other genital system and obstetric disorders(V13.29) 07/27/2012  . Maternal mental disorder, antepartum 07/27/2012    Past Surgical History:  Procedure Laterality Date  . LAPAROSCOPIC TUBAL LIGATION Bilateral 02/26/2013   Procedure: LAPAROSCOPIC TUBAL LIGATION;  Surgeon: Antionette Char, MD;  Location: [facility];  Service: Gynecology;  Laterality: Bilateral;  .  WISDOM TOOTH EXTRACTION       OB History    Gravida  5   Para  2   Term  2   Preterm      AB  3   Living  2     SAB  2   TAB  0   Ectopic      Multiple      Live Births  2            Home Medications    Prior to Admission medications   Medication Sig Start Date End Date Taking? Authorizing Provider  Adalimumab (HUMIRA) 40 MG/0.8ML PSKT Inject 40 mg into the skin See admin instructions. Inject 40 mg subcutaneously once every 2 weeks as needed for ulcerative colitis flares   Yes [provider]  methylphenidate 54 MG PO CR tablet Take 54 mg by mouth daily as needed (on school days).   Yes [provider]  naproxen sodium (ALEVE) 220 MG tablet Take 440 mg by mouth daily as needed (pain).   Yes [provider]  Omega-3 Fatty Acids (FISH OIL PO) Take 1 capsule by mouth daily.   Yes [provider]  methocarbamol (ROBAXIN) 500 MG tablet Take 2 tablets (1,000 mg total) by mouth 4 (four) times daily. Patient not taking: Reported on 11/02/2017 01/04/15   Renne Crigler, PA-C  oxyCODONE-acetaminophen (PERCOCET) 5-325 MG per tablet Take 2 tablets by mouth every 6 (six) hours  as needed for severe pain. Patient not taking: Reported on 11/02/2017 02/26/13   Antionette Char, MD    Family History Family History  Problem Relation Age of Onset  . Diabetes Mother   . Congestive Heart Failure Mother   . Diabetes Father   . Cancer Maternal Grandmother   . Heart disease Paternal Grandmother   . Diabetes Brother     Social History Social History   Tobacco Use  . Smoking status: Former Smoker    Packs/day: 0.25    Years: 0.50    Pack years: 0.12    Types: Cigarettes    Last attempt to quit: 06/30/2002    Years since quitting: 15.3  . Smokeless tobacco: Never Used  Substance Use Topics  . Alcohol use: No  . Drug use: No     Allergies   Patient has no known allergies.   Review of Systems Review of Systems  Cardiovascular:  Positive for chest pain.  All other systems reviewed and are negative.    Physical Exam Updated Vital Signs BP (!) 153/95 (BP Location: Right Arm)   Pulse 82   Temp 98.9 F (37.2 C) (Oral)   Resp 16   Ht 5\' 4"  (1.626 m)   Wt 125.2 kg   LMP 10/05/2017   SpO2 99%   BMI 47.38 kg/m   Physical Exam  Constitutional: She is oriented to person, place, and time. She appears well-developed.  HENT:  Head: Normocephalic.  Eyes: Pupils are equal, round, and reactive to light. EOM are normal.  Neck: Normal range of motion. Neck supple.  Cardiovascular: Normal rate, regular rhythm and normal pulses.  Pulmonary/Chest: Effort normal and breath sounds normal.  Reproducible chest tenderness   Abdominal: Soft. Bowel sounds are normal.  Musculoskeletal: Normal range of motion.       Right lower leg: Normal.       Left lower leg: Normal.  No calf tenderness   Neurological: She is alert and oriented to person, place, and time.  Skin: Skin is warm and dry. Capillary refill takes less than 2 seconds.  Psychiatric: She has a normal mood and affect. Her behavior is normal.  Nursing note and vitals reviewed.    ED Treatments / Results  Labs (all labs ordered are listed, but only abnormal results are displayed) Labs Reviewed  BASIC METABOLIC PANEL - Abnormal; Notable for the following components:      Result Value   Glucose, Bld 130 (*)    All other components within normal limits  CBC - Abnormal; Notable for the following components:   Hemoglobin 10.1 (*)    HCT 35.9 (*)    MCV 79.8 (*)    MCH 22.4 (*)    MCHC 28.1 (*)    Platelets 412 (*)    All other components within normal limits  I-STAT TROPONIN, ED  I-STAT BETA HCG BLOOD, ED ([facility], [facility], [facility] ONLY)  I-STAT TROPONIN, ED    EKG EKG Interpretation  Date/Time:  Sunday November 02 2017 17:21:22 EDT Ventricular Rate:  58 PR Interval:    QRS Duration: 96 QT Interval:  431 QTC Calculation: 424 R Axis:   90 Text Interpretation:   Sinus rhythm Borderline right axis deviation Probable anteroseptal infarct, old No previous ECGs available Confirmed by Richardean Canal ([id number]) on 11/02/2017 6:15:07 PM   Radiology Dg Chest 2 View  Result Date: 11/02/2017 CLINICAL DATA:  Medial chest pain for 1 day. EXAM: CHEST - 2 VIEW COMPARISON:  None. FINDINGS: The  heart size and mediastinal contours are within normal limits. Both lungs are clear. The visualized skeletal structures are unremarkable. IMPRESSION: No active cardiopulmonary disease. Electronically Signed   By: Gerome Sam III M.D   On: 11/02/2017 17:02    Procedures Procedures (including critical care time)  Medications Ordered in ED Medications  cyclobenzaprine (FLEXERIL) tablet 5 mg (5 mg Oral Given 11/02/17 1709)  ibuprofen (ADVIL,MOTRIN) tablet 800 mg (800 mg Oral Given 11/02/17 1708)     Initial Impression / Assessment and Plan / ED Course  I have reviewed the triage vital signs and the nursing notes.  Pertinent labs & imaging results that were available during my care of the patient were reviewed by me and considered in my medical decision making (see chart for details).    Patricia Fitzgerald is a 42 y.o. female here with chest pain. Pain is reproducible. I think likely muscle strain. Well appearing. I doubt PE or dissection, low risk for ACS. Will get labs, trop x 2, CXR.   8:02 PM Delta trop neg. Pain controlled with muscle relaxants. Stable for discharge. Likely chest wall pain     Final Clinical Impressions(s) / ED Diagnoses   Final diagnoses:  None    ED Discharge Orders    None       Charlynne Pander, MD 11/02/17 2002

## 2017-11-02 NOTE — ED Triage Notes (Signed)
C/o substernal chest pain/pressure that radiates to back that started last night while lying down.  Also reports lightheadedness.  Denies nausea, vomiting, and SOB.

## 2018-12-05 ENCOUNTER — Other Ambulatory Visit: Payer: Self-pay

## 2018-12-05 ENCOUNTER — Emergency Department (HOSPITAL_COMMUNITY)
Admission: EM | Admit: 2018-12-05 | Discharge: 2018-12-06 | Disposition: A | Discharge status: Home / Self Care | Payer: No Typology Code available for payment source | Attending: Emergency Medicine | Admitting: Emergency Medicine

## 2018-12-05 ENCOUNTER — Emergency Department (HOSPITAL_COMMUNITY): Payer: No Typology Code available for payment source

## 2018-12-05 DIAGNOSIS — Z23 Encounter for immunization: Secondary | ICD-10-CM | POA: Diagnosis not present

## 2018-12-05 DIAGNOSIS — Z86718 Personal history of other venous thrombosis and embolism: Secondary | ICD-10-CM | POA: Insufficient documentation

## 2018-12-05 DIAGNOSIS — Y92019 Unspecified place in single-family (private) house as the place of occurrence of the external cause: Secondary | ICD-10-CM | POA: Insufficient documentation

## 2018-12-05 DIAGNOSIS — Z79899 Other long term (current) drug therapy: Secondary | ICD-10-CM | POA: Insufficient documentation

## 2018-12-05 DIAGNOSIS — Y999 Unspecified external cause status: Secondary | ICD-10-CM | POA: Insufficient documentation

## 2018-12-05 DIAGNOSIS — Z8651 Personal history of combat and operational stress reaction: Secondary | ICD-10-CM | POA: Diagnosis not present

## 2018-12-05 DIAGNOSIS — S61213A Laceration without foreign body of left middle finger without damage to nail, initial encounter: Secondary | ICD-10-CM | POA: Diagnosis not present

## 2018-12-05 DIAGNOSIS — W540XXA Bitten by dog, initial encounter: Secondary | ICD-10-CM | POA: Insufficient documentation

## 2018-12-05 DIAGNOSIS — S61432A Puncture wound without foreign body of left hand, initial encounter: Secondary | ICD-10-CM | POA: Insufficient documentation

## 2018-12-05 DIAGNOSIS — Z87891 Personal history of nicotine dependence: Secondary | ICD-10-CM | POA: Insufficient documentation

## 2018-12-05 DIAGNOSIS — Y9389 Activity, other specified: Secondary | ICD-10-CM | POA: Insufficient documentation

## 2018-12-05 NOTE — ED Triage Notes (Signed)
Per pt was at home tonight and her dog bit her on her left hand. Bleeding is controlled. Pt said not sure about rabies vaccinations

## 2018-12-06 MED ORDER — AMOXICILLIN-POT CLAVULANATE 875-125 MG PO TABS: 1.0000 | ORAL_TABLET | Freq: Two times a day (BID) | ORAL | 0 refills | Status: AC

## 2018-12-06 MED ORDER — TETANUS-DIPHTH-ACELL PERTUSSIS 5-2.5-18.5 LF-MCG/0.5 IM SUSP
0.5000 mL | Freq: Once | INTRAMUSCULAR | Status: AC
  Administered 2018-12-06: 10:00:00 0.5 mL via INTRAMUSCULAR
  Filled 2018-12-06: qty 0.5

## 2018-12-06 MED ORDER — IBUPROFEN 800 MG PO TABS: 800.0000 mg | ORAL_TABLET | Freq: Three times a day (TID) | ORAL | 0 refills | Status: AC | PRN

## 2018-12-06 MED ORDER — OXYCODONE-ACETAMINOPHEN 5-325 MG PO TABS: 1.0000 | ORAL_TABLET | Freq: Four times a day (QID) | ORAL | 0 refills | Status: AC | PRN

## 2018-12-06 MED ORDER — OXYCODONE-ACETAMINOPHEN 5-325 MG PO TABS
1.0000 | ORAL_TABLET | Freq: Once | ORAL | Status: AC
  Administered 2018-12-06: 1 via ORAL
  Filled 2018-12-06: qty 1

## 2018-12-06 NOTE — ED Provider Notes (Signed)
[facility] EMERGENCY DEPARTMENT Provider Note   CSN: [id number] Arrival date & time: 12/05/18  2304     History   Chief Complaint Chief Complaint  Patient presents with  . Animal Bite    HPI Patricia Fitzgerald is a 43 y.o. female.     HPI Patient presents to the emergency department with dog bite to the left hand.  Patient states that she was bitten by her dog on the left hand.  The patient states that she did file pressure to control the bleeding.  Patient states that she did not take any medications prior to arrival for symptoms.  Patient has a dog bite to the distal end of the middle finger the pad and the palmar aspect of the hand just below the second digit.  The patient states that certain movements palpation make the pain worse.  The dog bite occurred just prior to arrival. Past Medical History:  Diagnosis Date  . Arthritis    all joints, both hips, left hip is worse  . Asthma    childhood, none as a adult  . Depression   . DVT (deep vein thrombosis) in pregnancy    left leg.  . IBS (irritable bowel syndrome)   . Inflammatory bowel diseases (IBD)   . PCOS (polycystic ovarian syndrome)   . Post traumatic stress disorder (PTSD)    ex-military combat stress for the PTSD  . PTSD (post-traumatic stress disorder)   . PTSD (post-traumatic stress disorder)   . UTI (lower urinary tract infection)   . Vaginal delivery 2010, 2014    Patient Active Problem List   Diagnosis Date Noted  . Encounter for female sterilization procedure 02/26/2013  . Normal delivery 12/30/2012  . Apnea, sleep 07/29/2012  . Elderly multigravida unspecified as to episode of care or not applicable 07/27/2012  . Personal history of other genital system and obstetric disorders(V13.29) 07/27/2012  . Maternal mental disorder, antepartum 07/27/2012    Past Surgical History:  Procedure Laterality Date  . LAPAROSCOPIC TUBAL LIGATION Bilateral 02/26/2013   Procedure: LAPAROSCOPIC TUBAL  LIGATION;  Surgeon: Antionette Char, MD;  Location: [facility];  Service: Gynecology;  Laterality: Bilateral;  . WISDOM TOOTH EXTRACTION       OB History    Gravida  5   Para  2   Term  2   Preterm      AB  3   Living  2     SAB  2   TAB  0   Ectopic      Multiple      Live Births  2            Home Medications    Prior to Admission medications   Medication Sig Start Date End Date Taking? Authorizing Provider  Adalimumab (HUMIRA) 40 MG/0.8ML PSKT Inject 40 mg into the skin See admin instructions. Inject 40 mg subcutaneously once every 2 weeks as needed for ulcerative colitis flares    [provider]  methocarbamol (ROBAXIN) 500 MG tablet Take 2 tablets (1,000 mg total) by mouth every 8 (eight) hours as needed for muscle spasms. 11/02/17   Charlynne Pander, MD  methylphenidate 54 MG PO CR tablet Take 54 mg by mouth daily as needed (on school days).    [provider]  naproxen sodium (ALEVE) 220 MG tablet Take 440 mg by mouth daily as needed (pain).    [provider]  Omega-3 Fatty Acids (FISH OIL PO) Take 1  capsule by mouth daily.    [provider]  oxyCODONE-acetaminophen (PERCOCET) 5-325 MG per tablet Take 2 tablets by mouth every 6 (six) hours as needed for severe pain. Patient not taking: Reported on 11/02/2017 02/26/13   Lahoma Crocker, MD    Family History Family History  Problem Relation Age of Onset  . Diabetes Mother   . Congestive Heart Failure Mother   . Diabetes Father   . Cancer Maternal Grandmother   . Heart disease Paternal Grandmother   . Diabetes Brother     Social History Social History   Tobacco Use  . Smoking status: Former Smoker    Packs/day: 0.25    Years: 0.50    Pack years: 0.12    Types: Cigarettes    Quit date: 06/30/2002    Years since quitting: 16.4  . Smokeless tobacco: Never Used  Substance Use Topics  . Alcohol use: No  . Drug use: No     Allergies   Patient has no  known allergies.   Review of Systems Review of Systems All other systems negative except as documented in the HPI. All pertinent positives and negatives as reviewed in the HPI.  Physical Exam Updated Vital Signs BP (!) 149/90 (BP Location: Left Arm)   Pulse 74   Temp 98.4 F (36.9 C) (Oral)   Resp 19   SpO2 100%   Physical Exam Vitals signs and nursing note reviewed.  Constitutional:      General: She is not in acute distress.    Appearance: She is well-developed.  HENT:     Head: Normocephalic and atraumatic.  Eyes:     Pupils: Pupils are equal, round, and reactive to light.  Pulmonary:     Effort: Pulmonary effort is normal.  Musculoskeletal:       Hands:  Skin:    General: Skin is warm and dry.  Neurological:     Mental Status: She is alert and oriented to person, place, and time.      ED Treatments / Results  Labs (all labs ordered are listed, but only abnormal results are displayed) Labs Reviewed - No data to display  EKG None  Radiology Dg Hand 2 View Left  Result Date: 12/05/2018 CLINICAL DATA:  43 year old female with dog bite to the hand. EXAM: LEFT HAND - 2 VIEW COMPARISON:  None. FINDINGS: There is no evidence of fracture or dislocation. There is no evidence of arthropathy or other focal bone abnormality. Soft tissues are unremarkable. IMPRESSION: Negative. Electronically Signed   By: Anner Crete M.D.   On: 12/05/2018 23:50    Procedures Procedures (including critical care time)  Medications Ordered in ED Medications  oxyCODONE-acetaminophen (PERCOCET/ROXICET) 5-325 MG per tablet 1 tablet (1 tablet Oral Given 12/06/18 0816)     Initial Impression / Assessment and Plan / ED Course  I have reviewed the triage vital signs and the nursing notes.  Pertinent labs & imaging results that were available during my care of the patient were reviewed by me and considered in my medical decision making (see chart for details).       Patient has  been stable here in the emergency department.  Patient has the wounds cleansed and soaked.  Will place patient on Augmentin and she is given close return precautions for any signs of infection or worsening in her condition.  Patient is advised to keep the areas clean and dry.  Final Clinical Impressions(s) / ED Diagnoses   Final diagnoses:  None  ED Discharge Orders    None       Charlestine NightLawyer, Jionni Helming, Cordelia Poche-C 12/09/18 1501    Lorre NickAllen, Anthony, MD 12/11/18 1430

## 2018-12-06 NOTE — Discharge Instructions (Addendum)
Return here for any worsening in your condition.  You need to monitor your hand that if you start noticing more swelling pain and redness you will need to come back here immediately.  Keep the area clean and dry.  I would keep the areas covered if you are going to be out and about.  You can soak your hand in warm water with Dial antibacterial soap to help keep the area clean.

## 2019-09-14 ENCOUNTER — Encounter (HOSPITAL_COMMUNITY): Payer: Self-pay

## 2019-09-14 ENCOUNTER — Other Ambulatory Visit: Payer: Self-pay

## 2019-09-14 ENCOUNTER — Emergency Department (HOSPITAL_COMMUNITY)
Admission: EM | Admit: 2019-09-14 | Discharge: 2019-09-15 | Disposition: A | Discharge status: Home / Self Care | Payer: No Typology Code available for payment source | Attending: Emergency Medicine | Admitting: Emergency Medicine

## 2019-09-14 ENCOUNTER — Emergency Department (HOSPITAL_COMMUNITY): Payer: No Typology Code available for payment source

## 2019-09-14 DIAGNOSIS — R0602 Shortness of breath: Secondary | ICD-10-CM | POA: Diagnosis not present

## 2019-09-14 DIAGNOSIS — R079 Chest pain, unspecified: Secondary | ICD-10-CM | POA: Insufficient documentation

## 2019-09-14 DIAGNOSIS — Z5321 Procedure and treatment not carried out due to patient leaving prior to being seen by health care provider: Secondary | ICD-10-CM | POA: Diagnosis not present

## 2019-09-14 LAB — CBC
HCT: 33.8 % — ABNORMAL LOW (ref 36.0–46.0)
Hemoglobin: 10.4 g/dL — ABNORMAL LOW (ref 12.0–15.0)
MCH: 26 pg (ref 26.0–34.0)
MCHC: 30.8 g/dL (ref 30.0–36.0)
MCV: 84.5 fL (ref 80.0–100.0)
Platelets: 351 10*3/uL (ref 150–400)
RBC: 4 MIL/uL (ref 3.87–5.11)
RDW: 14.5 % (ref 11.5–15.5)
WBC: 8.5 10*3/uL (ref 4.0–10.5)
nRBC: 0 % (ref 0.0–0.2)

## 2019-09-14 LAB — BASIC METABOLIC PANEL
Anion gap: 9 (ref 5–15)
BUN: 8 mg/dL (ref 6–20)
CO2: 27 mmol/L (ref 22–32)
Calcium: 9.1 mg/dL (ref 8.9–10.3)
Chloride: 101 mmol/L (ref 98–111)
Creatinine, Ser: 0.78 mg/dL (ref 0.44–1.00)
GFR calc Af Amer: >60 mL/min (ref >60)
GFR calc non Af Amer: >60 mL/min (ref >60)
Glucose, Bld: 178 mg/dL — ABNORMAL HIGH (ref 70–99)
Potassium: 4.2 mmol/L (ref 3.5–5.1)
Sodium: 137 mmol/L (ref 135–145)

## 2019-09-14 LAB — I-STAT BETA HCG BLOOD, ED (MC, WL, AP ONLY): I-stat hCG, quantitative: <5 m[IU]/mL (ref <5)

## 2019-09-14 LAB — TROPONIN I (HIGH SENSITIVITY): Troponin I (High Sensitivity): 3 ng/L (ref <18)

## 2019-09-14 NOTE — ED Triage Notes (Signed)
Pt reports that she has been ahving CP and SOB for the past 3 days, worse when laying down. Denies nausea or dizziness

## 2019-09-15 LAB — TROPONIN I (HIGH SENSITIVITY): Troponin I (High Sensitivity): 4 ng/L (ref <18)

## 2019-09-15 NOTE — ED Notes (Signed)
Patient states she has to get her kids ready for school. [facility] [place]. Encouraged patient to stay. LWBS
